# Patient Record
Sex: Female | Born: 1965 | Race: Black or African American | Hispanic: No | Marital: Single | Smoking: Former smoker
Health system: Southern US, Community
[De-identification: ages and names within clinical notes are randomized; demographics above are authoritative.]

## PROBLEM LIST (undated history)

## (undated) DIAGNOSIS — R5383 Other fatigue: Secondary | ICD-10-CM

## (undated) DIAGNOSIS — R011 Cardiac murmur, unspecified: Secondary | ICD-10-CM

## (undated) DIAGNOSIS — E119 Type 2 diabetes mellitus without complications: Secondary | ICD-10-CM

## (undated) DIAGNOSIS — E785 Hyperlipidemia, unspecified: Secondary | ICD-10-CM

## (undated) DIAGNOSIS — G4733 Obstructive sleep apnea (adult) (pediatric): Secondary | ICD-10-CM

## (undated) DIAGNOSIS — I2510 Atherosclerotic heart disease of native coronary artery without angina pectoris: Secondary | ICD-10-CM

## (undated) DIAGNOSIS — I10 Essential (primary) hypertension: Secondary | ICD-10-CM

## (undated) DIAGNOSIS — Z87891 Personal history of nicotine dependence: Secondary | ICD-10-CM

## (undated) HISTORY — DX: Essential (primary) hypertension: I10

## (undated) HISTORY — DX: Atherosclerotic heart disease of native coronary artery without angina pectoris: I25.10

## (undated) HISTORY — DX: Hyperlipidemia, unspecified: E78.5

## (undated) HISTORY — DX: Obstructive sleep apnea (adult) (pediatric): G47.33

## (undated) HISTORY — DX: Type 2 diabetes mellitus without complications: E11.9

## (undated) HISTORY — DX: Other fatigue: R53.83

## (undated) HISTORY — PX: ABDOMINAL HYSTERECTOMY: SHX81

## (undated) HISTORY — DX: Personal history of nicotine dependence: Z87.891

---

## 2005-10-15 ENCOUNTER — Ambulatory Visit: Payer: Self-pay | Admitting: Obstetrics and Gynecology

## 2012-02-15 ENCOUNTER — Other Ambulatory Visit: Payer: Self-pay | Admitting: Family Medicine

## 2016-07-22 DIAGNOSIS — L84 Corns and callosities: Secondary | ICD-10-CM | POA: Insufficient documentation

## 2016-07-22 DIAGNOSIS — E1165 Type 2 diabetes mellitus with hyperglycemia: Secondary | ICD-10-CM

## 2016-07-22 DIAGNOSIS — M7742 Metatarsalgia, left foot: Secondary | ICD-10-CM

## 2016-07-22 DIAGNOSIS — IMO0001 Reserved for inherently not codable concepts without codable children: Secondary | ICD-10-CM | POA: Insufficient documentation

## 2016-07-22 HISTORY — DX: Corns and callosities: L84

## 2016-07-22 HISTORY — DX: Metatarsalgia, left foot: M77.42

## 2018-02-11 DIAGNOSIS — K859 Acute pancreatitis without necrosis or infection, unspecified: Secondary | ICD-10-CM | POA: Diagnosis not present

## 2018-02-11 DIAGNOSIS — R109 Unspecified abdominal pain: Secondary | ICD-10-CM | POA: Diagnosis not present

## 2018-02-12 DIAGNOSIS — K859 Acute pancreatitis without necrosis or infection, unspecified: Secondary | ICD-10-CM | POA: Diagnosis not present

## 2018-02-12 DIAGNOSIS — R109 Unspecified abdominal pain: Secondary | ICD-10-CM | POA: Diagnosis not present

## 2018-06-08 ENCOUNTER — Encounter: Payer: Self-pay | Admitting: Cardiology

## 2018-06-24 ENCOUNTER — Ambulatory Visit: Payer: BLUE CROSS/BLUE SHIELD | Admitting: Cardiology

## 2018-06-24 ENCOUNTER — Telehealth: Payer: Self-pay

## 2018-06-24 DIAGNOSIS — E119 Type 2 diabetes mellitus without complications: Secondary | ICD-10-CM

## 2018-06-24 DIAGNOSIS — G4730 Sleep apnea, unspecified: Secondary | ICD-10-CM

## 2018-06-24 HISTORY — DX: Sleep apnea, unspecified: G47.30

## 2018-06-24 HISTORY — DX: Type 2 diabetes mellitus without complications: E11.9

## 2018-06-24 NOTE — Telephone Encounter (Signed)
Patient arrived to the office with a young child.  Her protocol and new virus advisories we told her to get back to her home and that we would be glad to see her if she comes by herself.  In the interim we also called her nurse practitioner Hulan Fray and discussed the situation with her.  Ms. Melinda Gordon mentioned to me that the patient was referred only for an abnormal EKG for preop.  She had no symptoms and since the elective surgeries have been postponed this referral and evaluation can also be postponed.  She said she will touch base with the patient and I referred when appropriate.  We will also keep the patient on her recall list according to protocol and the patient also has been advised to call us back for questions.

## 2018-06-24 NOTE — Telephone Encounter (Signed)
Patient was turned away today for new patient appt because she came with her child. Due to [facility] COVID protocol, patient only allowed in building. Dr. Janey Genta consulted and approved denial. Patient was referred by Hulan Fray, NP for an abnormal EKG. RN tried to set up a doctor/doctor conversation but Hulan Fray, NP is not in office. Message left for [place] Medical Director to call and speak with Dr.RRR.

## 2019-09-07 DIAGNOSIS — E1142 Type 2 diabetes mellitus with diabetic polyneuropathy: Secondary | ICD-10-CM

## 2019-09-07 HISTORY — DX: Type 2 diabetes mellitus with diabetic polyneuropathy: E11.42

## 2020-06-28 DIAGNOSIS — I10 Essential (primary) hypertension: Secondary | ICD-10-CM

## 2020-06-28 HISTORY — DX: Essential (primary) hypertension: I10

## 2020-11-22 ENCOUNTER — Encounter: Payer: Self-pay | Admitting: Physician Assistant

## 2020-11-22 LAB — HM MAMMOGRAPHY

## 2021-05-27 ENCOUNTER — Telehealth: Payer: Self-pay

## 2021-05-27 NOTE — Telephone Encounter (Signed)
I tried to contact the patient to confirm her new patient appointment for tomorrow, along with going over any pre-registration questions. I was unable to leave a message. The phone rang once and stated the call can not be completed at this time.

## 2021-05-28 ENCOUNTER — Ambulatory Visit (INDEPENDENT_AMBULATORY_CARE_PROVIDER_SITE_OTHER): Payer: 59 | Admitting: Physician Assistant

## 2021-05-28 ENCOUNTER — Encounter: Payer: Self-pay | Admitting: Physician Assistant

## 2021-05-28 ENCOUNTER — Other Ambulatory Visit: Payer: Self-pay

## 2021-05-28 VITALS — BP 130/70 | HR 99 | Temp 97.2°F | Ht 63.5 in | Wt 227.2 lb

## 2021-05-28 DIAGNOSIS — E559 Vitamin D deficiency, unspecified: Secondary | ICD-10-CM

## 2021-05-28 DIAGNOSIS — M79642 Pain in left hand: Secondary | ICD-10-CM | POA: Diagnosis not present

## 2021-05-28 DIAGNOSIS — M79641 Pain in right hand: Secondary | ICD-10-CM | POA: Diagnosis not present

## 2021-05-28 DIAGNOSIS — E1165 Type 2 diabetes mellitus with hyperglycemia: Secondary | ICD-10-CM | POA: Diagnosis not present

## 2021-05-28 MED ORDER — METFORMIN HCL 1000 MG PO TABS: 1000.0000 mg | ORAL_TABLET | Freq: Two times a day (BID) | ORAL | 0 refills | Status: AC

## 2021-05-28 MED ORDER — GLIMEPIRIDE 4 MG PO TABS: 4.0000 mg | ORAL_TABLET | Freq: Every day | ORAL | 0 refills | Status: DC

## 2021-05-28 MED ORDER — PIOGLITAZONE HCL 45 MG PO TABS: 45.0000 mg | ORAL_TABLET | Freq: Every day | ORAL | 0 refills | Status: DC

## 2021-05-28 NOTE — Progress Notes (Signed)
New Patient Office Visit  Subjective:  Patient ID: Melinda Gordon, female    DOB: [birth year]  Age: 56 y.o. MRN: [id number]  CC:  Chief Complaint  Patient presents with   Hand Pain   NEW PATIENT HPI Melinda Gordon presents for bilateral hand pain. She states that she has had trouble with her hands for quite awhile.  She has actually seen 2 hand specialists in past and has had OT as well.  Would like referral to specialist in her network.  She has had trouble with trigger fingers and generalized hand pain.  Records show she had carpal tunnel work up as well  Pt with history of NIDDM - states she has had for over 20 years.  She is currently taking amaryl, jardiance, metformin, and actos.  She had been seeing endocrinology but with new insurance needs referral to new provider.  She states she never takes her glucose readings, has not been to see optometrist, REFUSES statin or ACE/ARB treatment despite being told benefits of these medications  Pt with history of depression with anxiety - states symptoms well controlled on wellbutrin  Pt with history of stress incontinence and taking vesicare $RemoveBeforeD'5mg'[id number]$  qd - works well for her  Past Medical History:  Diagnosis Date   Diabetes mellitus without complication ([place])    Hyperlipidemia    Hypertension     Past Surgical History:  Procedure Laterality Date   ABDOMINAL HYSTERECTOMY      History reviewed. No pertinent family history.  Social History   Socioeconomic History   Marital status: Single    Spouse name: Not on file   Number of children: Not on file   Years of education: Not on file   Highest education level: Not on file  Occupational History   Not on file  Tobacco Use   Smoking status: Former    Years: 15.00    Types: Cigarettes   Smokeless tobacco: Never  Substance and Sexual Activity   Alcohol use: Never   Drug use: Never   Sexual activity: Not on file  Other Topics Concern   Not on file  Social History Narrative    Not on file   Social Determinants of Health   Financial Resource Strain: Not on file  Food Insecurity: Not on file  Transportation Needs: Not on file  Physical Activity: Not on file  Stress: Not on file  Social Connections: Not on file  Intimate Partner Violence: Not on file     Current Outpatient Medications:    aspirin 81 MG EC tablet, Take by mouth., Disp: , Rfl:    buPROPion (WELLBUTRIN XL) 300 MG 24 hr tablet, TAKE 1 TABLET BY MOUTH EVERY 24 HOURS, Disp: , Rfl:    Cholecalciferol (D-3-5 PO), Take by mouth., Disp: , Rfl:    glucose blood (ONETOUCH VERIO) test strip, 3 (three) times daily., Disp: , Rfl:    JARDIANCE 25 MG TABS tablet, Take by mouth., Disp: , Rfl:    Omega-3 Fatty Acids (OMEGA-3 FISH OIL PO), Take by mouth., Disp: , Rfl:    OneTouch Delica Lancets 78I MISC, 3 (three) times daily., Disp: , Rfl:    solifenacin (VESICARE) 5 MG tablet, Take 5 mg by mouth daily., Disp: , Rfl:    glimepiride (AMARYL) 4 MG tablet, Take 1 tablet (4 mg total) by mouth daily with breakfast., Disp: 90 tablet, Rfl: 0   metFORMIN (GLUCOPHAGE) 1000 MG tablet, Take 1 tablet (1,000 mg total) by mouth 2 (two) times daily., Disp:  180 tablet, Rfl: 0   pioglitazone (ACTOS) 45 MG tablet, Take 1 tablet (45 mg total) by mouth daily., Disp: 90 tablet, Rfl: 0   Allergies  Allergen Reactions   Dicloxacillin    Cephalexin Rash    Causes acute pancreatitis     ROS CONSTITUTIONAL: Negative for chills, fatigue, fever, unintentional weight gain and unintentional weight loss.  E/N/T: Negative for ear pain, nasal congestion and sore throat.  CARDIOVASCULAR: Negative for chest pain, dizziness, palpitations and pedal edema.  RESPIRATORY: Negative for recent cough and dyspnea.  GASTROINTESTINAL: Negative for abdominal pain, acid reflux symptoms, constipation, diarrhea, nausea and vomiting.  MSK: Negative for arthralgias and myalgias.  INTEGUMENTARY: Negative for rash.  NEUROLOGICAL: Negative for dizziness  and headaches.  PSYCHIATRIC: Negative for sleep disturbance and to question depression screen.  Negative for depression, negative for anhedonia.        Objective:   PHYSICAL EXAM:   VS: BP 130/70 (BP Location: Left Arm, Patient Position: Sitting, Cuff Size: Large)    Pulse 99    Temp (!) 97.2 F (36.2 C) (Temporal)    Ht 5' 3.5" (1.613 m)    Wt 227 lb 3.2 oz (103.1 kg)    SpO2 96%    BMI 39.62 kg/m   GEN: Well nourished, well developed, in no acute distress  Cardiac: RRR; no murmurs, rubs, or gallops,no edema -  Respiratory:  normal respiratory rate and pattern with no distress - normal breath sounds with no rales, rhonchi, wheezes or rubs Skin: warm and dry, no rash  Psych: euthymic mood, appropriate affect and demeanor  Health Maintenance Due  Topic Date Due   HEMOGLOBIN A1C  Never done   FOOT EXAM  Never done   OPHTHALMOLOGY EXAM  Never done   URINE MICROALBUMIN  Never done    There are no preventive care reminders to display for this patient.  No results found for: TSH No results found for: WBC, HGB, HCT, MCV, PLT No results found for: NA, K, CHLORIDE, CO2, GLUCOSE, BUN, CREATININE, BILITOT, ALKPHOS, AST, ALT, PROT, ALBUMIN, CALCIUM, ANIONGAP, EGFR, GFR No results found for: CHOL No results found for: HDL No results found for: LDLCALC No results found for: TRIG No results found for: CHOLHDL No results found for: HGBA1C    Assessment & Plan:   Problem List Items Addressed This Visit       Endocrine   Diabetes ([place]) - Primary   Relevant Medications   aspirin 81 MG EC tablet   JARDIANCE 25 MG TABS tablet   glimepiride (AMARYL) 4 MG tablet   metFORMIN (GLUCOPHAGE) 1000 MG tablet   pioglitazone (ACTOS) 45 MG tablet   Other Relevant Orders   Microalbumin / creatinine urine ratio   CBC with Differential/Platelet   Comprehensive metabolic panel   TSH   Lipid panel   Hemoglobin A1c   Ambulatory referral to Endocrinology   Other Visit Diagnoses     Vitamin D  deficiency       Relevant Orders   VITAMIN D 25 Hydroxy (Vit-D Deficiency, Fractures)   Bilateral hand pain       Relevant Orders   Ambulatory referral to Orthopedic Surgery       Meds ordered this encounter  Medications   glimepiride (AMARYL) 4 MG tablet    Sig: Take 1 tablet (4 mg total) by mouth daily with breakfast.    Dispense:  90 tablet    Refill:  0    Order Specific Question:   Supervising  Provider    Answer:   Rochel Brome [[id number]]   metFORMIN (GLUCOPHAGE) 1000 MG tablet    Sig: Take 1 tablet (1,000 mg total) by mouth 2 (two) times daily.    Dispense:  180 tablet    Refill:  0    Order Specific Question:   Supervising Provider    Answer:   Shelton Silvas   pioglitazone (ACTOS) 45 MG tablet    Sig: Take 1 tablet (45 mg total) by mouth daily.    Dispense:  90 tablet    Refill:  0    Order Specific Question:   Supervising Provider    Answer:   Shelton Silvas    Follow-up: Return in about 3 months (around 08/25/2021) for chronic fasting follow up --- tomorrow for fasting labwork nurse.    SARA R Chrishawn Boley, PA-C

## 2021-05-29 ENCOUNTER — Other Ambulatory Visit: Payer: 59

## 2021-06-05 ENCOUNTER — Ambulatory Visit (INDEPENDENT_AMBULATORY_CARE_PROVIDER_SITE_OTHER): Payer: 59

## 2021-06-05 ENCOUNTER — Other Ambulatory Visit: Payer: 59

## 2021-06-05 ENCOUNTER — Other Ambulatory Visit: Payer: Self-pay

## 2021-06-05 ENCOUNTER — Encounter: Payer: Self-pay | Admitting: Orthopedic Surgery

## 2021-06-05 ENCOUNTER — Ambulatory Visit (INDEPENDENT_AMBULATORY_CARE_PROVIDER_SITE_OTHER): Payer: 59 | Admitting: Orthopedic Surgery

## 2021-06-05 ENCOUNTER — Ambulatory Visit: Payer: Self-pay

## 2021-06-05 VITALS — BP 142/77 | HR 70 | Ht 63.5 in | Wt 227.0 lb

## 2021-06-05 DIAGNOSIS — M79641 Pain in right hand: Secondary | ICD-10-CM | POA: Diagnosis not present

## 2021-06-05 DIAGNOSIS — M79644 Pain in right finger(s): Secondary | ICD-10-CM

## 2021-06-05 DIAGNOSIS — M79642 Pain in left hand: Secondary | ICD-10-CM

## 2021-06-05 NOTE — Progress Notes (Signed)
? ?Office Visit Note ?  ?Patient: Melinda Gordon           ?Date of Birth: Dec 22, 1965           ?MRN: [id number] ?Visit Date: 06/05/2021 ?             ?Requested by: Marge Duncans, PA-C ?[address] ?[address] ?[place],  [place] [address] ?PCP: Marge Duncans, PA-C ? ? ?Assessment & Plan: ?Visit Diagnoses:  ?1. Pain in both hands   ?2. Finger pain, right   ? ? ?Plan: Patient's atraumatic right index finger pain and swelling may be secondary to a mass at the level of the proximal phalanx.  The swelling seems to be limited to the radial aspect of the index finger over the proximal phalanx between the MP and PIP joints.  The joints themselves do not appear swollen.  She may also have some triggering with subjective locking sensation on full passive range of motion.  We discussed getting an ultrasound to further evaluate this radial aspect of her finger.  It may show [place] a mass in the area but it may also show diffuse swelling. ? ?Follow-Up Instructions: No follow-ups on file.  ? ?Orders:  ?Orders Placed This Encounter  ?Procedures  ? XR Hand Complete Right  ? XR Hand Complete Left  ? [place] RT UPPER EXTREM LTD SOFT TISSUE NON VASCULAR  ? ?No orders of the defined types were placed in this encounter. ? ? ? ? Procedures: ?No procedures performed ? ? ?Clinical Data: ?No additional findings. ? ? ?Subjective: ?Chief Complaint  ?Patient presents with  ? Right Hand - Pain  ? Left Hand - Pain  ? ? ?This is a 56 year old right-hand-dominant female who presents with pain and swelling involving the right index finger.  This has been going on for about a month.  She denies any injury to this finger.  She denies any cuts, scrapes, or other breaks in the skin.  She has decreased range of motion of this finger secondary to pain and tightness.  The swelling seems to be localized to the area of the radial aspect of the proximal phalanx between the MP and PIP joints.  The joints themselves do not appear swollen.  She has questionable subjective  triggering this finger. ? ? ?Review of Systems ? ? ?Objective: ?Vital Signs: BP (!) 142/77 (BP Location: Left Arm, Patient Position: Sitting)   Pulse 70   Ht 5' 3.5" (1.613 m)   Wt 227 lb (103 kg)   BMI 39.58 kg/m?  ? ?Physical Exam ?Constitutional:   ?   Appearance: Normal appearance.  ?Cardiovascular:  ?   Rate and Rhythm: Normal rate.  ?   Pulses: Normal pulses.  ?Pulmonary:  ?   Effort: Pulmonary effort is normal.  ?Skin: ?   General: Skin is warm and dry.  ?   Capillary Refill: Capillary refill takes less than 2 seconds.  ?Neurological:  ?   Mental Status: She is alert.  ? ? ?Right Hand Exam  ? ?Tenderness  ?Right hand tenderness location: Mildly TTP at radial aspect of index finger between MP and PIP joints. ? ?Other  ?Erythema: absent ?Sensation: normal ?Pulse: present ? ?Comments:  Fullness at radial aspect of index finger over proximal phalanx.  No swelling proximal or distal.  Limited AROM secondary to pain and tightness.  Near full PROM but lacks approx 1 cm from [facility] and limited by pain.  ? ? ? ? ?Specialty Comments:  ?No specialty  comments available. ? ?Imaging: ?No results found. ? ? ?PMFS History: ?Patient Active Problem List  ? Diagnosis Date Noted  ? Essential hypertension 06/28/2020  ? Diabetic polyneuropathy associated with type 2 diabetes mellitus ([place]) 09/07/2019  ? Diabetes ([place]) 06/24/2018  ? Sleep apnea 06/24/2018  ? Foot callus 07/22/2016  ? Metatarsalgia, left foot 07/22/2016  ? Uncontrolled type 2 diabetes mellitus without complication, without long-term current use of insulin 07/22/2016  ? ?Past Medical History:  ?Diagnosis Date  ? Diabetes mellitus without complication ([place])   ? Hyperlipidemia   ? Hypertension   ?  ?No family history on file.  ?Past Surgical History:  ?Procedure Laterality Date  ? ABDOMINAL HYSTERECTOMY    ? ?Social History  ? ?Occupational History  ? Not on file  ?Tobacco Use  ? Smoking status: Former  ?  Years: 15.00  ?  Types: Cigarettes  ? Smokeless tobacco: Never   ?Substance and Sexual Activity  ? Alcohol use: Never  ? Drug use: Never  ? Sexual activity: Not on file  ? ? ? ? ? ? ?

## 2021-06-06 LAB — COMPREHENSIVE METABOLIC PANEL
ALT: 17 IU/L (ref 0–32)
AST: 15 IU/L (ref 0–40)
Albumin/Globulin Ratio: 1.8 (ref 1.2–2.2)
Albumin: 4.8 g/dL (ref 3.8–4.9)
Alkaline Phosphatase: 108 IU/L (ref 44–121)
BUN/Creatinine Ratio: 17 (ref 9–23)
BUN: 17 mg/dL (ref 6–24)
Bilirubin Total: <0.2 mg/dL (ref 0.0–1.2)
CO2: 23 mmol/L (ref 20–29)
Calcium: 10.1 mg/dL (ref 8.7–10.2)
Chloride: 106 mmol/L (ref 96–106)
Creatinine, Ser: 1.02 mg/dL — ABNORMAL HIGH (ref 0.57–1.00)
Globulin, Total: 2.6 g/dL (ref 1.5–4.5)
Glucose: 240 mg/dL — ABNORMAL HIGH (ref 70–99)
Potassium: 5.5 mmol/L — ABNORMAL HIGH (ref 3.5–5.2)
Sodium: 143 mmol/L (ref 134–144)
Total Protein: 7.4 g/dL (ref 6.0–8.5)
eGFR: 65 mL/min/{1.73_m2} (ref >59)

## 2021-06-06 LAB — CBC WITH DIFFERENTIAL/PLATELET
Basophils Absolute: 0 10*3/uL (ref 0.0–0.2)
Basos: 1 %
EOS (ABSOLUTE): 0.1 10*3/uL (ref 0.0–0.4)
Eos: 2 %
Hematocrit: 40.2 % (ref 34.0–46.6)
Hemoglobin: 13.1 g/dL (ref 11.1–15.9)
Immature Grans (Abs): 0 10*3/uL (ref 0.0–0.1)
Immature Granulocytes: 1 %
Lymphocytes Absolute: 2.4 10*3/uL (ref 0.7–3.1)
Lymphs: 38 %
MCH: 28.1 pg (ref 26.6–33.0)
MCHC: 32.6 g/dL (ref 31.5–35.7)
MCV: 86 fL (ref 79–97)
Monocytes Absolute: 0.6 10*3/uL (ref 0.1–0.9)
Monocytes: 9 %
Neutrophils Absolute: 3.2 10*3/uL (ref 1.4–7.0)
Neutrophils: 49 %
Platelets: 311 10*3/uL (ref 150–450)
RBC: 4.66 x10E6/uL (ref 3.77–5.28)
RDW: 12.9 % (ref 11.7–15.4)
WBC: 6.2 10*3/uL (ref 3.4–10.8)

## 2021-06-06 LAB — TSH: TSH: 1.27 u[IU]/mL (ref 0.450–4.500)

## 2021-06-06 LAB — HEMOGLOBIN A1C
Est. average glucose Bld gHb Est-mCnc: 226 mg/dL
Hgb A1c MFr Bld: 9.5 % — ABNORMAL HIGH (ref 4.8–5.6)

## 2021-06-06 LAB — CARDIOVASCULAR RISK ASSESSMENT

## 2021-06-06 LAB — LIPID PANEL
Chol/HDL Ratio: 4.1 ratio (ref 0.0–4.4)
Cholesterol, Total: 243 mg/dL — ABNORMAL HIGH (ref 100–199)
HDL: 60 mg/dL (ref >39)
LDL Chol Calc (NIH): 165 mg/dL — ABNORMAL HIGH (ref 0–99)
Triglycerides: 100 mg/dL (ref 0–149)
VLDL Cholesterol Cal: 18 mg/dL (ref 5–40)

## 2021-06-06 LAB — VITAMIN D 25 HYDROXY (VIT D DEFICIENCY, FRACTURES): Vit D, 25-Hydroxy: 56.1 ng/mL (ref 30.0–100.0)

## 2021-06-09 ENCOUNTER — Telehealth: Payer: Self-pay

## 2021-06-09 NOTE — Telephone Encounter (Signed)
Pt has been referred to endocrinology --- will let them address and treat since patient noncompliant ?

## 2021-06-09 NOTE — Telephone Encounter (Signed)
Patient notified of lab results.  She refused a statin and she also refused an injectable medication  Will make Marianne Sofia, PA aware of patient's decision.   ?

## 2021-06-12 ENCOUNTER — Ambulatory Visit (HOSPITAL_COMMUNITY)
Admission: RE | Admit: 2021-06-12 | Discharge: 2021-06-12 | Disposition: A | Discharge status: Home / Self Care | Payer: 59 | Source: Ambulatory Visit | Attending: Orthopedic Surgery | Admitting: Orthopedic Surgery

## 2021-06-12 ENCOUNTER — Other Ambulatory Visit: Payer: Self-pay

## 2021-06-12 DIAGNOSIS — M79644 Pain in right finger(s): Secondary | ICD-10-CM | POA: Insufficient documentation

## 2021-06-13 ENCOUNTER — Other Ambulatory Visit: Payer: Self-pay | Admitting: Physician Assistant

## 2021-07-07 ENCOUNTER — Encounter: Payer: Self-pay | Admitting: Physician Assistant

## 2021-07-07 ENCOUNTER — Ambulatory Visit (INDEPENDENT_AMBULATORY_CARE_PROVIDER_SITE_OTHER): Payer: 59 | Admitting: Physician Assistant

## 2021-07-07 VITALS — BP 138/86 | HR 88 | Temp 98.5°F | Ht 63.5 in | Wt 228.2 lb

## 2021-07-07 DIAGNOSIS — G4739 Other sleep apnea: Secondary | ICD-10-CM | POA: Diagnosis not present

## 2021-07-07 DIAGNOSIS — E1165 Type 2 diabetes mellitus with hyperglycemia: Secondary | ICD-10-CM | POA: Diagnosis not present

## 2021-07-07 MED ORDER — RYBELSUS 3 MG PO TABS: 3.0000 mg | ORAL_TABLET | Freq: Every day | ORAL | 0 refills | Status: DC

## 2021-07-07 NOTE — Progress Notes (Signed)
? ?Subjective:  ?Patient ID: Melinda Gordon, female    DOB: [birth year]  Age: 56 y.o. MRN: [id number] ? ?Chief Complaint  ?Patient presents with  ? Diabetes  ? ? ?HPI ? Pt with history of diabetes - she has refused statin and ARB treatment - she had been referred to endocrinologist at her last appt but she has chosen to put that appt off stating she wants to discuss losing weight first ?She is on several medications for her diabetes and glucose has been out of control - she refuses any type of injectible medication including insulin to get it under control ?Pt very argumentative about refusing any type of other medications but at this point she is on maximum dose of all the other meds she is currently taking ?Pt came here today to discuss being put on medication for weight loss (which with her history of hypertension and refusal of needles and uncontrolled diabetes) I feel that rybelsus would be the best agent to try first ? ?Pt states that she has a history of sleep apnea and would like to be referred to sleep clinic to get another type of CPAP machine to help.  She is having night time awakenings  ?It has been more than 5 years since her last sleep study ?Current Outpatient Medications on File Prior to Visit  ?Medication Sig Dispense Refill  ? aspirin 81 MG EC tablet Take by mouth.    ? buPROPion (WELLBUTRIN XL) 300 MG 24 hr tablet TAKE 1 TABLET BY MOUTH EVERY 24 HOURS    ? Cholecalciferol (D-3-5 PO) Take by mouth.    ? glimepiride (AMARYL) 4 MG tablet Take 1 tablet (4 mg total) by mouth daily with breakfast. 90 tablet 0  ? JARDIANCE 25 MG TABS tablet Take by mouth.    ? metFORMIN (GLUCOPHAGE) 1000 MG tablet Take 1 tablet (1,000 mg total) by mouth 2 (two) times daily. 180 tablet 0  ? Omega-3 Fatty Acids (OMEGA-3 FISH OIL PO) Take by mouth.    ? pioglitazone (ACTOS) 45 MG tablet Take 1 tablet (45 mg total) by mouth daily. 90 tablet 0  ? solifenacin (VESICARE) 5 MG tablet TAKE 1 TABLET BY MOUTH DAILY 90 tablet 0   ? ?No current facility-administered medications on file prior to visit.  ? ?Past Medical History:  ?Diagnosis Date  ? Diabetes mellitus without complication ([facility])   ? Hyperlipidemia   ? Hypertension   ? ?Past Surgical History:  ?Procedure Laterality Date  ? ABDOMINAL HYSTERECTOMY    ?  ?History reviewed. No pertinent family history. ?Social History  ? ?Socioeconomic History  ? Marital status: Single  ?  Spouse name: Not on file  ? Number of children: Not on file  ? Years of education: Not on file  ? Highest education level: Not on file  ?Occupational History  ? Not on file  ?Tobacco Use  ? Smoking status: Former  ?  Years: 15.00  ?  Types: Cigarettes  ? Smokeless tobacco: Never  ?Substance and Sexual Activity  ? Alcohol use: Never  ? Drug use: Never  ? Sexual activity: Not on file  ?Other Topics Concern  ? Not on file  ?Social History Narrative  ? Not on file  ? ?Social Determinants of Health  ? ?Financial Resource Strain: Not on file  ?Food Insecurity: Not on file  ?Transportation Needs: Not on file  ?Physical Activity: Not on file  ?Stress: Not on file  ?Social Connections: Not on file  ? ? ?Review  of Systems ?CONSTITUTIONAL: Negative for chills, fatigue, fever, unintentional weight gain and unintentional weight loss.  ?CARDIOVASCULAR: Negative for chest pain, dizziness, palpitations and pedal edema.  ?RESPIRATORY: Negative for recent cough and dyspnea.  ?INTEGUMENTARY: Negative for rash.  ?PSYCHIATRIC: Negative for sleep disturbance and to question depression screen.  Negative for depression, negative for anhedonia.  ?   ? ? ?Objective:  ?BP 138/86   Pulse 88   Temp 98.5 ?F (36.9 ?C)   Ht 5' 3.5" (1.613 m)   Wt 228 lb 3.2 oz (103.5 kg)   SpO2 96%   BMI 39.79 kg/m?  ? ? ?  07/07/2021  ?  1:30 PM 06/05/2021  ? 10:32 AM 05/28/2021  ?  2:17 PM  ?BP/Weight  ?Systolic BP 138 142 130  ?Diastolic BP 86 77 70  ?Wt. (Lbs) 228.2 227 227.2  ?BMI 39.79 kg/m2 39.58 kg/m2 39.62 kg/m2  ? ? ?Physical Exam ?PHYSICAL EXAM:   ? ?VS: BP 138/86   Pulse 88   Temp 98.5 ?F (36.9 ?C)   Ht 5' 3.5" (1.613 m)   Wt 228 lb 3.2 oz (103.5 kg)   SpO2 96%   BMI 39.79 kg/m?  ? ?GEN: Well nourished, well developed, in no acute distress  ?Cardiac: RRR; no murmurs, ?Respiratory:  normal respiratory rate and pattern with no distress - normal breath sounds with no rales, rhonchi, wheezes or rubs ?MS: no deformity or atrophy  ?Skin: warm and dry, no rash  ?Psych: euthymic mood, appropriate affect and demeanor ? ?Diabetic Foot Exam - Simple   ?No data filed ?  ?  ? ?Lab Results  ?Component Value Date  ? WBC 6.2 06/05/2021  ? HGB 13.1 06/05/2021  ? HCT 40.2 06/05/2021  ? PLT 311 06/05/2021  ? GLUCOSE 240 (H) 06/05/2021  ? CHOL 243 (H) 06/05/2021  ? TRIG 100 06/05/2021  ? HDL 60 06/05/2021  ? LDLCALC 165 (H) 06/05/2021  ? ALT 17 06/05/2021  ? AST 15 06/05/2021  ? NA 143 06/05/2021  ? K 5.5 (H) 06/05/2021  ? CL 106 06/05/2021  ? CREATININE 1.02 (H) 06/05/2021  ? BUN 17 06/05/2021  ? CO2 23 06/05/2021  ? TSH 1.270 06/05/2021  ? HGBA1C 9.5 (H) 06/05/2021  ? ? ? ? ?Assessment & Plan:  ? ?Problem List Items Addressed This Visit   ? ?  ? Respiratory  ? Sleep apnea  ? Relevant Orders  ? Ambulatory referral to Sleep Studies  ?  ? Endocrine  ? Diabetes ([facility]) - Primary  ? Relevant Medications  ? Semaglutide (RYBELSUS) 3 MG TABS  ?. ? ?Meds ordered this encounter  ?Medications  ? Semaglutide (RYBELSUS) 3 MG TABS  ?  Sig: Take 3 mg by mouth daily.  ?  Dispense:  30 tablet  ?  Refill:  0  ?  Order Specific Question:   Supervising Provider  ?  AnswerBlane Ohara [[id number]]  ? ? ?Orders Placed This Encounter  ?Procedures  ? Ambulatory referral to Sleep Studies  ?  ? ?Follow-up: Return in about 4 weeks (around 08/04/2021) for after 5/22 - for fasting follow up appt. ? ?An After Visit Summary was printed and given to the patient. ? ?SARA R Lelia Jons, PA-C ?[facility] ?[phone] ?

## 2021-07-09 ENCOUNTER — Telehealth: Payer: Self-pay

## 2021-07-09 ENCOUNTER — Telehealth: Payer: Self-pay | Admitting: Orthopedic Surgery

## 2021-07-09 NOTE — Telephone Encounter (Signed)
Patient left a voicemail requesting a referral to Dermatology. Wants to see Dr. Lois Huxley [address] [place], [place], [place], [address], for her hairloss. ?

## 2021-07-09 NOTE — Telephone Encounter (Signed)
Patient called advised she was told that she needed an MRI because when she had the Ultrasound they couldn't determine what was wrong with her right hand index finger. Patient said she need to be set up for the MRI. The number to contact patient is [phone] ?

## 2021-07-10 ENCOUNTER — Other Ambulatory Visit: Payer: Self-pay | Admitting: Physician Assistant

## 2021-07-10 DIAGNOSIS — L659 Nonscarring hair loss, unspecified: Secondary | ICD-10-CM

## 2021-08-04 ENCOUNTER — Ambulatory Visit (INDEPENDENT_AMBULATORY_CARE_PROVIDER_SITE_OTHER): Payer: 59 | Admitting: Physician Assistant

## 2021-08-04 ENCOUNTER — Encounter: Payer: Self-pay | Admitting: Physician Assistant

## 2021-08-04 VITALS — BP 132/84 | HR 99 | Temp 97.1°F | Resp 18 | Ht 63.5 in | Wt 221.6 lb

## 2021-08-04 DIAGNOSIS — R899 Unspecified abnormal finding in specimens from other organs, systems and tissues: Secondary | ICD-10-CM

## 2021-08-04 DIAGNOSIS — E1165 Type 2 diabetes mellitus with hyperglycemia: Secondary | ICD-10-CM

## 2021-08-04 DIAGNOSIS — K219 Gastro-esophageal reflux disease without esophagitis: Secondary | ICD-10-CM | POA: Diagnosis not present

## 2021-08-04 MED ORDER — RYBELSUS 7 MG PO TABS
7.0000 mg | ORAL_TABLET | Freq: Every day | ORAL | 1 refills | Status: DC
Start: 2021-08-04 — End: 2021-10-02

## 2021-08-04 MED ORDER — OMEPRAZOLE 40 MG PO CPDR: 40.0000 mg | DELAYED_RELEASE_CAPSULE | Freq: Every day | ORAL | 3 refills | Status: DC

## 2021-08-04 NOTE — Progress Notes (Signed)
? ?Subjective:  ?Patient ID: Melinda Gordon, female    DOB: December 18, 1965  Age: 56 y.o. MRN: [id number] ? ?Chief Complaint  ?Patient presents with  ? Gastroesophageal Reflux  ? ? ?HPI ? Pt states that she has had a history of reflux in the past - used to be on medication  ?Noted in the past month she has had lots of belching and burping along with reflux symptoms ? ?Pt here for follow up after starting rybelsus - she states she is doing well on this medication and thinks it is helping her glucose levels.  She has actually lost 7 pounds since last visit ?Is exercising as well ? ?Pt due to recheck cmp - last visit had slightly elevated potassium ?Current Outpatient Medications on File Prior to Visit  ?Medication Sig Dispense Refill  ? aspirin 81 MG EC tablet Take by mouth.    ? buPROPion (WELLBUTRIN XL) 300 MG 24 hr tablet TAKE 1 TABLET BY MOUTH EVERY 24 HOURS    ? Cholecalciferol (D-3-5 PO) Take by mouth.    ? glimepiride (AMARYL) 4 MG tablet Take 1 tablet (4 mg total) by mouth daily with breakfast. 90 tablet 0  ? JARDIANCE 25 MG TABS tablet Take by mouth.    ? metFORMIN (GLUCOPHAGE) 1000 MG tablet Take 1 tablet (1,000 mg total) by mouth 2 (two) times daily. 180 tablet 0  ? Omega-3 Fatty Acids (OMEGA-3 FISH OIL PO) Take by mouth.    ? pioglitazone (ACTOS) 45 MG tablet Take 1 tablet (45 mg total) by mouth daily. 90 tablet 0  ? solifenacin (VESICARE) 5 MG tablet TAKE 1 TABLET BY MOUTH DAILY 90 tablet 0  ? ?No current facility-administered medications on file prior to visit.  ? ?Past Medical History:  ?Diagnosis Date  ? Diabetes mellitus without complication (HCC)   ? Hyperlipidemia   ? Hypertension   ? ?Past Surgical History:  ?Procedure Laterality Date  ? ABDOMINAL HYSTERECTOMY    ?  ?History reviewed. No pertinent family history. ?Social History  ? ?Socioeconomic History  ? Marital status: Single  ?  Spouse name: Not on file  ? Number of children: Not on file  ? Years of education: Not on file  ? Highest education  level: Not on file  ?Occupational History  ? Not on file  ?Tobacco Use  ? Smoking status: Former  ?  Years: 15.00  ?  Types: Cigarettes  ? Smokeless tobacco: Never  ?Substance and Sexual Activity  ? Alcohol use: Never  ? Drug use: Never  ? Sexual activity: Not on file  ?Other Topics Concern  ? Not on file  ?Social History Narrative  ? Not on file  ? ?Social Determinants of Health  ? ?Financial Resource Strain: Not on file  ?Food Insecurity: Not on file  ?Transportation Needs: Not on file  ?Physical Activity: Not on file  ?Stress: Not on file  ?Social Connections: Not on file  ? ? ?Review of Systems ?CONSTITUTIONAL: Negative for chills, fatigue, fever, unintentional weight gain and unintentional weight loss.  ? ?CARDIOVASCULAR: Negative for chest pain, dizziness, palpitations and pedal edema.  ?RESPIRATORY: Negative for recent cough and dyspnea.  ?GASTROINTESTINAL:see HPI ? ? ? ?Objective:  ?BP 132/84   Pulse 99   Temp (!) 97.1 ?F (36.2 ?C)   Resp 18   Ht 5' 3.5" (1.613 m)   Wt 221 lb 9.6 oz (100.5 kg)   SpO2 98%   BMI 38.64 kg/m?  ? ? ?  08/04/2021  ?  10:14 AM 07/07/2021  ?  1:30 PM 06/05/2021  ? 10:32 AM  ?BP/Weight  ?Systolic BP 132 138 142  ?Diastolic BP 84 86 77  ?Wt. (Lbs) 221.6 228.2 227  ?BMI 38.64 kg/m2 39.79 kg/m2 39.58 kg/m2  ? ? ?Physical Exam ?PHYSICAL EXAM:  ? ?VS: BP 132/84   Pulse 99   Temp (!) 97.1 ?F (36.2 ?C)   Resp 18   Ht 5' 3.5" (1.613 m)   Wt 221 lb 9.6 oz (100.5 kg)   SpO2 98%   BMI 38.64 kg/m?  ? ?GEN: Well nourished, well developed, in no acute distress  ? ?Cardiac: RRR; no murmurs, ?Respiratory:  normal respiratory rate and pattern with no distress - normal breath sounds with no rales, rhonchi, wheezes or rubs ? ?Psych: euthymic mood, appropriate affect and demeanor ? ?Diabetic Foot Exam - Simple   ?No data filed ?  ?  ? ?Lab Results  ?Component Value Date  ? WBC 6.2 06/05/2021  ? HGB 13.1 06/05/2021  ? HCT 40.2 06/05/2021  ? PLT 311 06/05/2021  ? GLUCOSE 240 (H) 06/05/2021  ? CHOL  243 (H) 06/05/2021  ? TRIG 100 06/05/2021  ? HDL 60 06/05/2021  ? LDLCALC 165 (H) 06/05/2021  ? ALT 17 06/05/2021  ? AST 15 06/05/2021  ? NA 143 06/05/2021  ? K 5.5 (H) 06/05/2021  ? CL 106 06/05/2021  ? CREATININE 1.02 (H) 06/05/2021  ? BUN 17 06/05/2021  ? CO2 23 06/05/2021  ? TSH 1.270 06/05/2021  ? HGBA1C 9.5 (H) 06/05/2021  ? ? ? ? ?Assessment & Plan:  ? ?Problem List Items Addressed This Visit   ? ?  ? Endocrine  ? Diabetes (HCC)  ? Relevant Medications  ? Semaglutide (RYBELSUS) 7 MG TABS  ? ?Other Visit Diagnoses   ? ? Gastroesophageal reflux disease without esophagitis    -  Primary  ? Relevant Medications  ? omeprazole (PRILOSEC) 40 MG capsule  ? Abnormal laboratory test      ? Relevant Orders  ? Comprehensive metabolic panel  ? ?  ?. ? ?Meds ordered this encounter  ?Medications  ? Semaglutide (RYBELSUS) 7 MG TABS  ?  Sig: Take 7 mg by mouth daily.  ?  Dispense:  30 tablet  ?  Refill:  1  ?  Order Specific Question:   Supervising Provider  ?  AnswerBlane Ohara [[id number]]  ? omeprazole (PRILOSEC) 40 MG capsule  ?  Sig: Take 1 capsule (40 mg total) by mouth daily.  ?  Dispense:  30 capsule  ?  Refill:  3  ?  Order Specific Question:   Supervising Provider  ?  AnswerBlane Ohara [[id number]]  ? ? ?Orders Placed This Encounter  ?Procedures  ? Comprehensive metabolic panel  ?  ? ?Follow-up: Return for cancel 5/24 --- needs chronic fasting mid to late June. ? ?An After Visit Summary was printed and given to the patient. ? ?SARA R Anan Dapolito, PA-C ?[facility] ?[phone]

## 2021-08-05 LAB — COMPREHENSIVE METABOLIC PANEL
ALT: 17 IU/L (ref 0–32)
AST: 14 IU/L (ref 0–40)
Albumin/Globulin Ratio: 2 (ref 1.2–2.2)
Albumin: 4.6 g/dL (ref 3.8–4.9)
Alkaline Phosphatase: 107 IU/L (ref 44–121)
BUN/Creatinine Ratio: 21 (ref 9–23)
BUN: 20 mg/dL (ref 6–24)
Bilirubin Total: <0.2 mg/dL (ref 0.0–1.2)
CO2: 23 mmol/L (ref 20–29)
Calcium: 10.2 mg/dL (ref 8.7–10.2)
Chloride: 99 mmol/L (ref 96–106)
Creatinine, Ser: 0.97 mg/dL (ref 0.57–1.00)
Globulin, Total: 2.3 g/dL (ref 1.5–4.5)
Glucose: 187 mg/dL — ABNORMAL HIGH (ref 70–99)
Potassium: 4.7 mmol/L (ref 3.5–5.2)
Sodium: 140 mmol/L (ref 134–144)
Total Protein: 6.9 g/dL (ref 6.0–8.5)
eGFR: 69 mL/min/{1.73_m2} (ref >59)

## 2021-08-25 ENCOUNTER — Other Ambulatory Visit: Payer: Self-pay

## 2021-08-25 ENCOUNTER — Telehealth: Payer: Self-pay

## 2021-08-25 NOTE — Telephone Encounter (Signed)
Patient left a voicemail stating she was taking glimepiride bid, however her latest rx was for once daily. She stated she does not recall discussing this change and questioned if her rx is correct. I see where she recently started rybelsus, I am unsure if this is the answer to her "question".

## 2021-08-26 ENCOUNTER — Other Ambulatory Visit: Payer: Self-pay | Admitting: Family Medicine

## 2021-08-27 ENCOUNTER — Ambulatory Visit: Payer: 59 | Admitting: Physician Assistant

## 2021-08-27 ENCOUNTER — Other Ambulatory Visit: Payer: Self-pay

## 2021-08-27 DIAGNOSIS — E1165 Type 2 diabetes mellitus with hyperglycemia: Secondary | ICD-10-CM

## 2021-08-27 MED ORDER — GLIMEPIRIDE 4 MG PO TABS: 4.0000 mg | ORAL_TABLET | Freq: Every day | ORAL | 0 refills | Status: DC

## 2021-08-27 NOTE — Telephone Encounter (Signed)
Patient VU however she is in need of refill. Refill request sent to [facility].   Terrill Mohr 08/27/21 4:36 PM

## 2021-09-06 ENCOUNTER — Other Ambulatory Visit: Payer: Self-pay | Admitting: Physician Assistant

## 2021-10-01 ENCOUNTER — Ambulatory Visit (INDEPENDENT_AMBULATORY_CARE_PROVIDER_SITE_OTHER): Payer: 59 | Admitting: Physician Assistant

## 2021-10-01 ENCOUNTER — Encounter: Payer: Self-pay | Admitting: Physician Assistant

## 2021-10-01 VITALS — BP 114/68 | HR 101 | Temp 97.1°F | Ht 63.5 in | Wt 211.0 lb

## 2021-10-01 DIAGNOSIS — E1165 Type 2 diabetes mellitus with hyperglycemia: Secondary | ICD-10-CM | POA: Diagnosis not present

## 2021-10-01 DIAGNOSIS — E559 Vitamin D deficiency, unspecified: Secondary | ICD-10-CM | POA: Diagnosis not present

## 2021-10-01 DIAGNOSIS — K219 Gastro-esophageal reflux disease without esophagitis: Secondary | ICD-10-CM

## 2021-10-01 NOTE — Progress Notes (Signed)
Subjective:  Patient ID: Melinda Gordon, female    DOB: Sep 30, 1965  Age: 57 y.o. MRN: [id number]  Chief Complaint  Patient presents with   Diabetes    HPI  Pt in today for follow up of diabetes.  She states she is not checking her glucose at all - when asked why she initially says 'because not know how' and I stated I could show her now at visit or set her up for nurse visit to do this and she refused stating she actually does not like needles and 'will probably never check her glucose anyway' I advised she can speak with her insurance to see if Dexcom or Freestyle Josephine Igo is covered and she states she may call. Pt states she takes fish oil but refuses any type of statin treatment and also refuses ACE/ARB treatment Pt is overdue for eye exam - recommend to schedule appt Pt has appt in October to establish with endocrinologist  Pt with history of anxiety - stable on wellbutrin  Pt with history of GERD - stable on prilosec Current Outpatient Medications on File Prior to Visit  Medication Sig Dispense Refill   aspirin 81 MG EC tablet Take by mouth.     buPROPion (WELLBUTRIN XL) 300 MG 24 hr tablet TAKE 1 TABLET BY MOUTH EVERY 24 HOURS     Cholecalciferol (D-3-5 PO) Take by mouth.     glimepiride (AMARYL) 4 MG tablet Take 1 tablet (4 mg total) by mouth daily with breakfast. 90 tablet 0   JARDIANCE 25 MG TABS tablet Take by mouth.     metFORMIN (GLUCOPHAGE) 1000 MG tablet Take 1 tablet (1,000 mg total) by mouth 2 (two) times daily. 180 tablet 0   Omega-3 Fatty Acids (OMEGA-3 FISH OIL PO) Take by mouth.     omeprazole (PRILOSEC) 40 MG capsule Take 1 capsule (40 mg total) by mouth daily. 30 capsule 3   pioglitazone (ACTOS) 45 MG tablet Take 1 tablet (45 mg total) by mouth daily. 90 tablet 0   Semaglutide (RYBELSUS) 7 MG TABS Take 7 mg by mouth daily. 30 tablet 1   solifenacin (VESICARE) 5 MG tablet TAKE 1 TABLET BY MOUTH DAILY 90 tablet 0   No current facility-administered medications on  file prior to visit.   Past Medical History:  Diagnosis Date   Diabetes mellitus without complication (HCC)    Hyperlipidemia    Hypertension    Past Surgical History:  Procedure Laterality Date   ABDOMINAL HYSTERECTOMY      History reviewed. No pertinent family history. Social History   Socioeconomic History   Marital status: Single    Spouse name: Not on file   Number of children: Not on file   Years of education: Not on file   Highest education level: Not on file  Occupational History   Not on file  Tobacco Use   Smoking status: Former    Years: 15.00    Types: Cigarettes   Smokeless tobacco: Never  Substance and Sexual Activity   Alcohol use: Never   Drug use: Never   Sexual activity: Not on file  Other Topics Concern   Not on file  Social History Narrative   Not on file   Social Determinants of Health   Financial Resource Strain: Not on file  Food Insecurity: Not on file  Transportation Needs: Not on file  Physical Activity: Not on file  Stress: Not on file  Social Connections: Not on file    Review of Systems  CONSTITUTIONAL: Negative for chills, fatigue, fever, unintentional weight gain and unintentional weight loss.  E/N/T: Negative for ear pain, nasal congestion and sore throat.  CARDIOVASCULAR: Negative for chest pain, dizziness, palpitations and pedal edema.  RESPIRATORY: Negative for recent cough and dyspnea.  GASTROINTESTINAL: Negative for abdominal pain, acid reflux symptoms, constipation, diarrhea, nausea and vomiting.  MSK: Negative for arthralgias and myalgias.  INTEGUMENTARY: Negative for rash.  NEUROLOGICAL: Negative for dizziness and headaches.  PSYCHIATRIC: Negative for sleep disturbance and to question depression screen.  Negative for depression, negative for anhedonia.         Objective:  BP 114/68 (BP Location: Right Arm, Patient Position: Sitting)   Pulse (!) 101   Temp (!) 97.1 F (36.2 C) (Temporal)   Ht 5' 3.5" (1.613 m)    Wt 211 lb (95.7 kg)   SpO2 98%   BMI 36.79 kg/m      10/01/2021   10:07 AM 08/04/2021   10:14 AM 07/07/2021    1:30 PM  BP/Weight  Systolic BP 114 132 138  Diastolic BP 68 84 86  Wt. (Lbs) 211 221.6 228.2  BMI 36.79 kg/m2 38.64 kg/m2 39.79 kg/m2    Physical Exam PHYSICAL EXAM:   VS: BP 114/68 (BP Location: Right Arm, Patient Position: Sitting)   Pulse (!) 101   Temp (!) 97.1 F (36.2 C) (Temporal)   Ht 5' 3.5" (1.613 m)   Wt 211 lb (95.7 kg)   SpO2 98%   BMI 36.79 kg/m   GEN: Well nourished, well developed, in no acute distress  Cardiac: RRR; no murmurs, rubs, or gallops,no edema - no significant varicosities Respiratory:  normal respiratory rate and pattern with no distress - normal breath sounds with no rales, rhonchi, wheezes or rubs Skin: warm and dry, no rash  Psych: euthymic mood, appropriate affect and demeanor  Diabetic Foot Exam - Simple   Simple Foot Form Diabetic Foot exam was performed with the following findings: Yes 10/01/2021 10:34 AM  Visual Inspection No deformities, no ulcerations, no other skin breakdown bilaterally: Yes Sensation Testing Intact to touch and monofilament testing bilaterally: Yes Pulse Check Posterior Tibialis and Dorsalis pulse intact bilaterally: Yes Comments      Lab Results  Component Value Date   WBC 6.2 06/05/2021   HGB 13.1 06/05/2021   HCT 40.2 06/05/2021   PLT 311 06/05/2021   GLUCOSE 187 (H) 08/04/2021   CHOL 243 (H) 06/05/2021   TRIG 100 06/05/2021   HDL 60 06/05/2021   LDLCALC 165 (H) 06/05/2021   ALT 17 08/04/2021   AST 14 08/04/2021   NA 140 08/04/2021   K 4.7 08/04/2021   CL 99 08/04/2021   CREATININE 0.97 08/04/2021   BUN 20 08/04/2021   CO2 23 08/04/2021   TSH 1.270 06/05/2021   HGBA1C 9.5 (H) 06/05/2021      Assessment & Plan:   Problem List Items Addressed This Visit       Endocrine   Diabetes (HCC) - Primary   Relevant Orders   CBC with Differential/Platelet   Comprehensive metabolic  panel   Lipid panel   Hemoglobin A1c Continue current meds   Other Visit Diagnoses     Vitamin D deficiency     Labwork pending   Gastroesophageal reflux disease without esophagitis     Continue meds     .  No orders of the defined types were placed in this encounter.   Orders Placed This Encounter  Procedures   CBC with Differential/Platelet  Comprehensive metabolic panel   Lipid panel   Hemoglobin A1c     Follow-up: Return in about 3 months (around 01/01/2022) for chronic fasting follow up.  An After Visit Summary was printed and given to the patient.  Yetta Flock [facility] [phone]

## 2021-10-02 ENCOUNTER — Other Ambulatory Visit: Payer: Self-pay | Admitting: Physician Assistant

## 2021-10-02 LAB — CBC WITH DIFFERENTIAL/PLATELET
Basophils Absolute: 0 10*3/uL (ref 0.0–0.2)
Basos: 0 %
EOS (ABSOLUTE): 0.1 10*3/uL (ref 0.0–0.4)
Eos: 1 %
Hematocrit: 44 % (ref 34.0–46.6)
Hemoglobin: 13.6 g/dL (ref 11.1–15.9)
Immature Grans (Abs): 0 10*3/uL (ref 0.0–0.1)
Immature Granulocytes: 0 %
Lymphocytes Absolute: 2.8 10*3/uL (ref 0.7–3.1)
Lymphs: 37 %
MCH: 27.2 pg (ref 26.6–33.0)
MCHC: 30.9 g/dL — ABNORMAL LOW (ref 31.5–35.7)
MCV: 88 fL (ref 79–97)
Monocytes Absolute: 0.6 10*3/uL (ref 0.1–0.9)
Monocytes: 8 %
Neutrophils Absolute: 4 10*3/uL (ref 1.4–7.0)
Neutrophils: 54 %
Platelets: 333 10*3/uL (ref 150–450)
RBC: 5 x10E6/uL (ref 3.77–5.28)
RDW: 13.1 % (ref 11.7–15.4)
WBC: 7.5 10*3/uL (ref 3.4–10.8)

## 2021-10-02 LAB — MICROALBUMIN / CREATININE URINE RATIO
Creatinine, Urine: 132.4 mg/dL
Microalb/Creat Ratio: 256 mg/g creat — ABNORMAL HIGH (ref 0–29)
Microalbumin, Urine: 338.6 ug/mL

## 2021-10-02 LAB — COMPREHENSIVE METABOLIC PANEL
ALT: 14 IU/L (ref 0–32)
AST: 15 IU/L (ref 0–40)
Albumin/Globulin Ratio: 1.6 (ref 1.2–2.2)
Albumin: 4.7 g/dL (ref 3.8–4.9)
Alkaline Phosphatase: 107 IU/L (ref 44–121)
BUN/Creatinine Ratio: 16 (ref 9–23)
BUN: 18 mg/dL (ref 6–24)
Bilirubin Total: 0.2 mg/dL (ref 0.0–1.2)
CO2: 22 mmol/L (ref 20–29)
Calcium: 10.3 mg/dL — ABNORMAL HIGH (ref 8.7–10.2)
Chloride: 100 mmol/L (ref 96–106)
Creatinine, Ser: 1.1 mg/dL — ABNORMAL HIGH (ref 0.57–1.00)
Globulin, Total: 2.9 g/dL (ref 1.5–4.5)
Glucose: 149 mg/dL — ABNORMAL HIGH (ref 70–99)
Potassium: 4.8 mmol/L (ref 3.5–5.2)
Sodium: 139 mmol/L (ref 134–144)
Total Protein: 7.6 g/dL (ref 6.0–8.5)
eGFR: 59 mL/min/{1.73_m2} — ABNORMAL LOW (ref >59)

## 2021-10-02 LAB — LIPID PANEL
Chol/HDL Ratio: 4.1 ratio (ref 0.0–4.4)
Cholesterol, Total: 209 mg/dL — ABNORMAL HIGH (ref 100–199)
HDL: 51 mg/dL (ref >39)
LDL Chol Calc (NIH): 135 mg/dL — ABNORMAL HIGH (ref 0–99)
Triglycerides: 129 mg/dL (ref 0–149)
VLDL Cholesterol Cal: 23 mg/dL (ref 5–40)

## 2021-10-02 LAB — HEMOGLOBIN A1C
Est. average glucose Bld gHb Est-mCnc: 169 mg/dL
Hgb A1c MFr Bld: 7.5 % — ABNORMAL HIGH (ref 4.8–5.6)

## 2021-10-02 LAB — CARDIOVASCULAR RISK ASSESSMENT

## 2021-10-02 MED ORDER — RYBELSUS 14 MG PO TABS: 14.0000 mg | ORAL_TABLET | Freq: Every day | ORAL | 2 refills | Status: DC

## 2021-12-01 ENCOUNTER — Other Ambulatory Visit: Payer: Self-pay | Admitting: Physician Assistant

## 2021-12-01 DIAGNOSIS — K219 Gastro-esophageal reflux disease without esophagitis: Secondary | ICD-10-CM

## 2021-12-05 ENCOUNTER — Other Ambulatory Visit: Payer: Self-pay | Admitting: Physician Assistant

## 2021-12-05 DIAGNOSIS — E1165 Type 2 diabetes mellitus with hyperglycemia: Secondary | ICD-10-CM

## 2021-12-07 ENCOUNTER — Other Ambulatory Visit: Payer: Self-pay | Admitting: Nurse Practitioner

## 2021-12-07 DIAGNOSIS — E1165 Type 2 diabetes mellitus with hyperglycemia: Secondary | ICD-10-CM

## 2021-12-30 ENCOUNTER — Other Ambulatory Visit: Payer: Self-pay | Admitting: Physician Assistant

## 2022-01-05 ENCOUNTER — Telehealth: Payer: Self-pay

## 2022-01-05 ENCOUNTER — Ambulatory Visit: Payer: 59 | Admitting: Internal Medicine

## 2022-01-05 NOTE — Telephone Encounter (Signed)
Have pt tell you what she is taking and how she is taking it ---- I have reviewed her meds and she has been noncompliant --

## 2022-01-05 NOTE — Telephone Encounter (Signed)
Patient called and stated she is confused on what medication she should be taking for diabetes. Can you look and make sure her list is right? Also she didn't get to make her endo appointment due to them not taking her insurance and she just found out today that we don't take her insurance as well, So she is having to change her insurance to what both office will take.

## 2022-01-05 NOTE — Telephone Encounter (Signed)
Patient stated she is taking Metformin 1000 mg twice a day, glimepiride 4 mg daily, Actos 45 mg, jardiance 24 mg daily. She is not taking the rybelsus due to her pharmacy not having it in stock.

## 2022-01-06 ENCOUNTER — Ambulatory Visit: Payer: 59 | Admitting: Physician Assistant

## 2022-01-06 ENCOUNTER — Other Ambulatory Visit: Payer: Self-pay

## 2022-01-06 MED ORDER — JARDIANCE 25 MG PO TABS
25.0000 mg | ORAL_TABLET | Freq: Every day | ORAL | 1 refills | Status: AC
Start: 2022-01-06 — End: ?

## 2022-01-06 NOTE — Telephone Encounter (Signed)
Patient made aware. Verbalized Understanding. 

## 2022-01-06 NOTE — Telephone Encounter (Signed)
Patient stated that she actually has been taking half of the 14 mg, she was cutting them in half due to the medication causing her nausea but she has not been taking it as she she should have.  Rybelsus 14 mg cutting in half for about a week, she has only been taking 1/2 a day. Jardiance 25 mg  Metformin 1000 mg twice a day Actos 45 mg once a day Glimepride 4 mg once day  Patient made aware to keep medication the same until she come in for fasting follow up. Patient will schedule a fasting follow up as soon as her insurance change.

## 2022-01-06 NOTE — Telephone Encounter (Signed)
So exactly how long has she been off of any rybelsus - she could have called [place] to let us know pharamacy did not have and we could have adjusted dose or found another pharmacy that had it When was last time she even took the rybelsus 7mg ?

## 2022-02-27 ENCOUNTER — Other Ambulatory Visit: Payer: Self-pay | Admitting: Physician Assistant

## 2022-02-27 DIAGNOSIS — K219 Gastro-esophageal reflux disease without esophagitis: Secondary | ICD-10-CM

## 2022-03-16 ENCOUNTER — Other Ambulatory Visit: Payer: Self-pay

## 2022-03-19 ENCOUNTER — Other Ambulatory Visit: Payer: Self-pay

## 2022-03-31 ENCOUNTER — Other Ambulatory Visit: Payer: Self-pay | Admitting: Family Medicine

## 2022-03-31 ENCOUNTER — Other Ambulatory Visit: Payer: Self-pay | Admitting: Nurse Practitioner

## 2022-03-31 DIAGNOSIS — E1165 Type 2 diabetes mellitus with hyperglycemia: Secondary | ICD-10-CM

## 2022-10-03 ENCOUNTER — Other Ambulatory Visit: Payer: Self-pay | Admitting: Family Medicine

## 2022-10-03 DIAGNOSIS — E1165 Type 2 diabetes mellitus with hyperglycemia: Secondary | ICD-10-CM

## 2022-11-18 IMAGING — US US EXTREM UP *R* LTD
1 series · 14 of 25 positions shown · non-contrast
Comparison: Radiograph 06/05/2021

CLINICAL DATA: Right index finger swelling

EXAM:
ULTRASOUND RIGHT UPPER EXTREMITY LIMITED
TECHNIQUE: Ultrasound examination of the upper extremity soft tissues was
performed in the area of clinical concern.

[Series 1: us soft tissue right upper extremity limited (non- · 26 acquisitions, 14 frames shown]
[im 1/26]
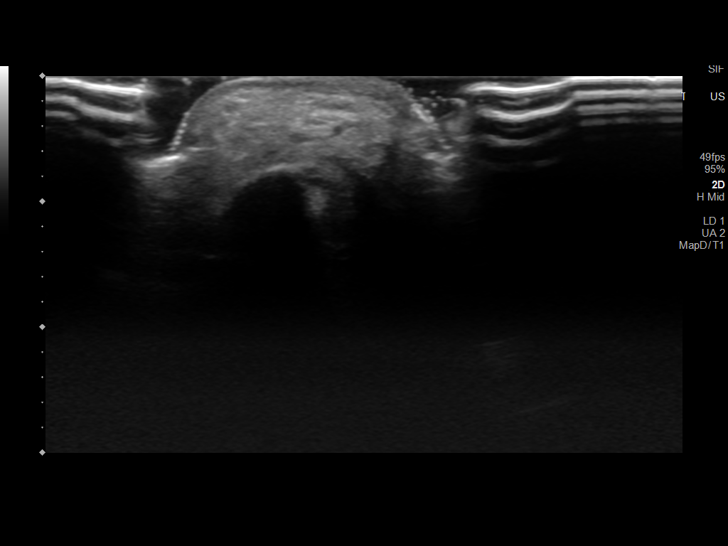
[im 3/26]
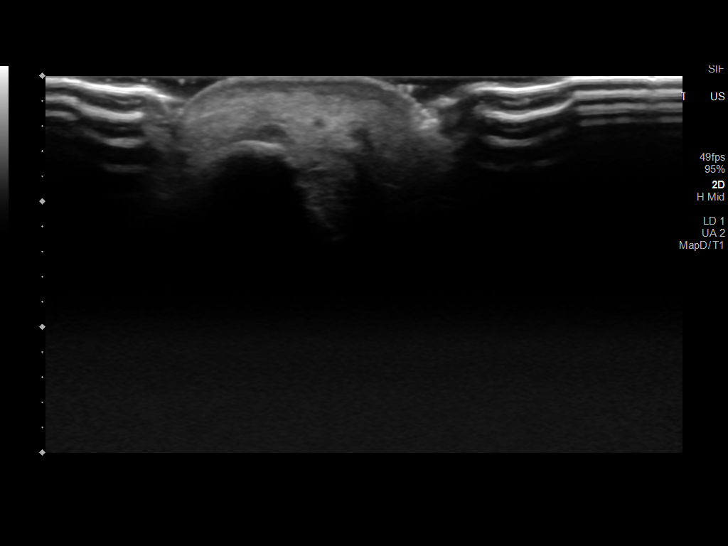
[im 5/26]
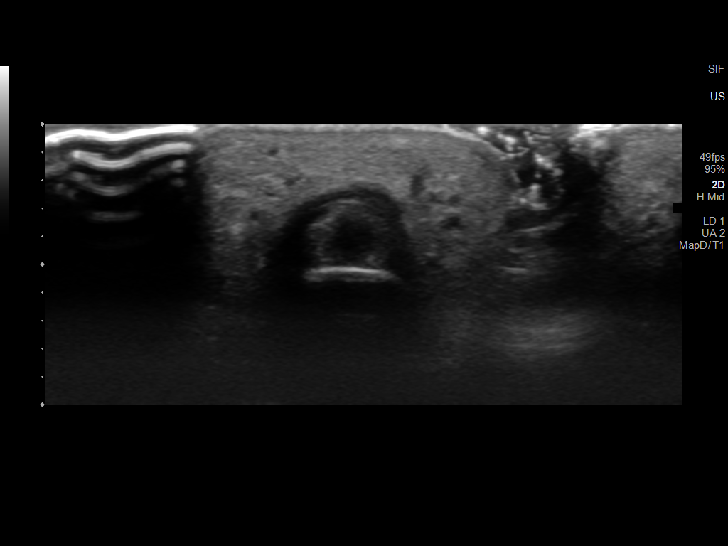
[im 7/26]
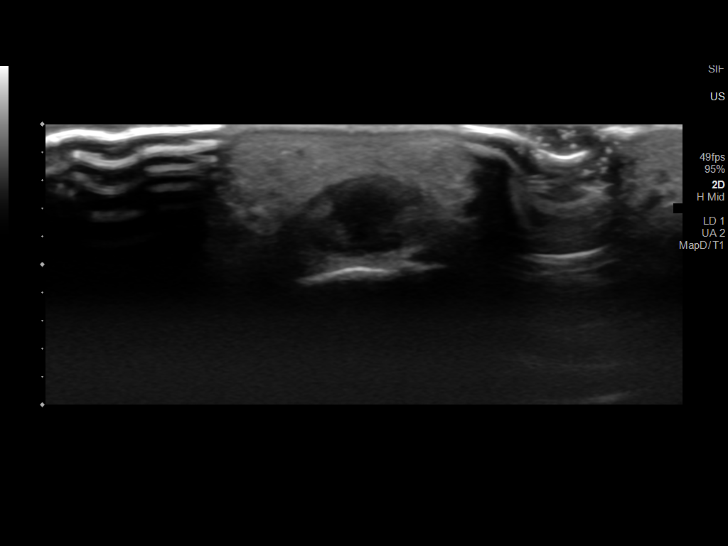
[im 9/26]
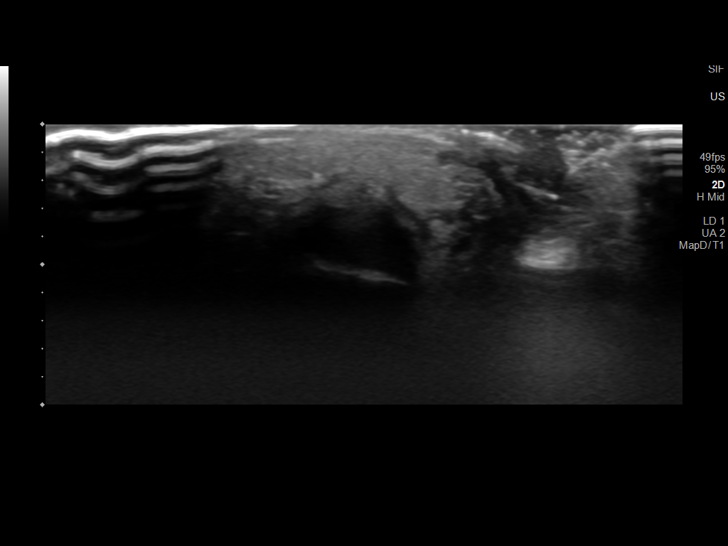
[im 10/26]
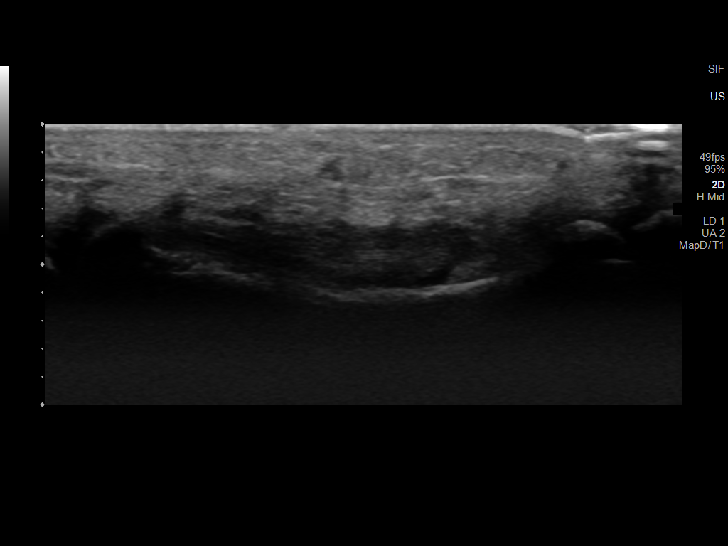
[im 12/26]
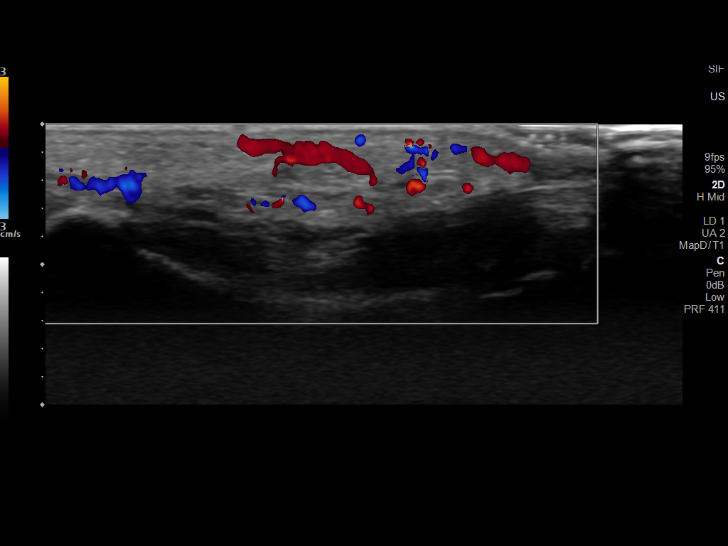
[im 14/26]
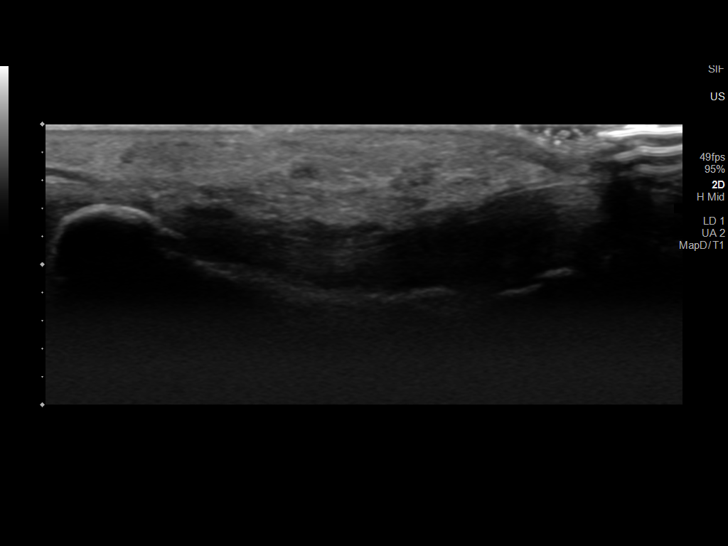
[im 16/26]
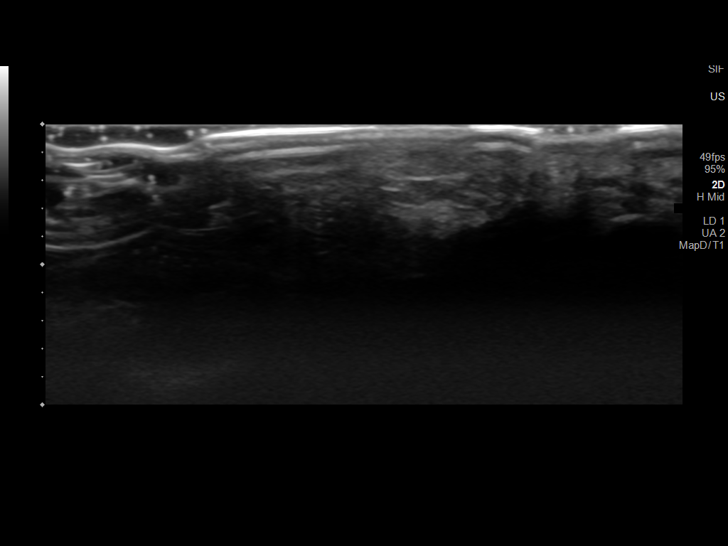
[im 17/26]
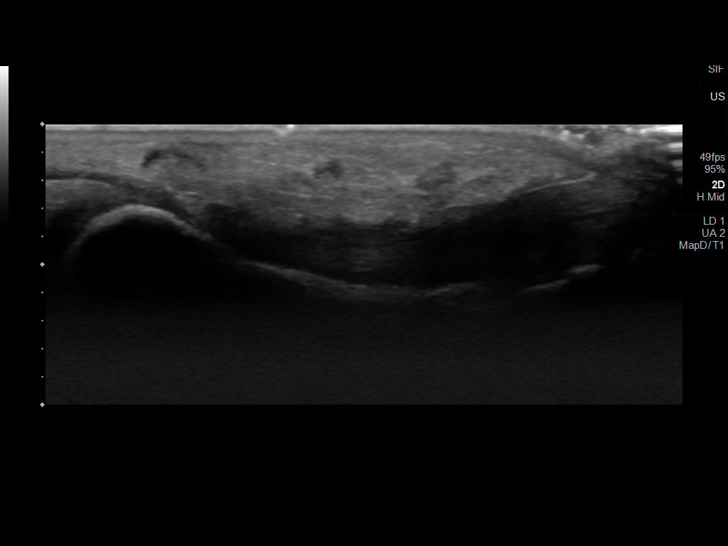
[im 19/26]
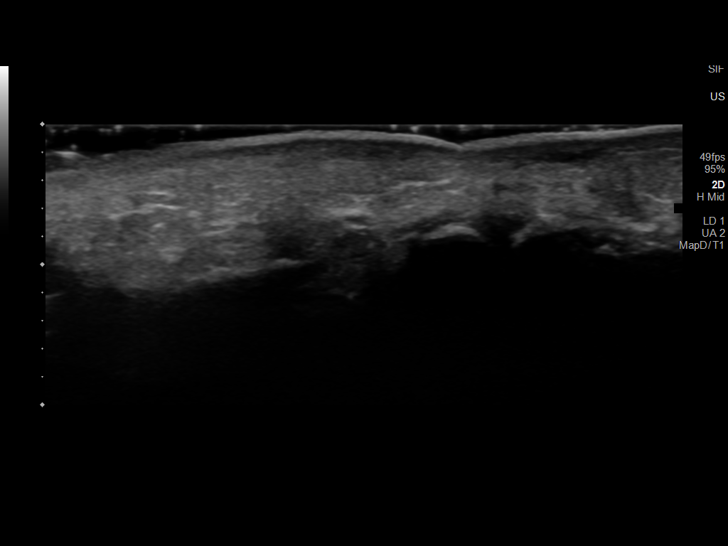
[im 21/26]
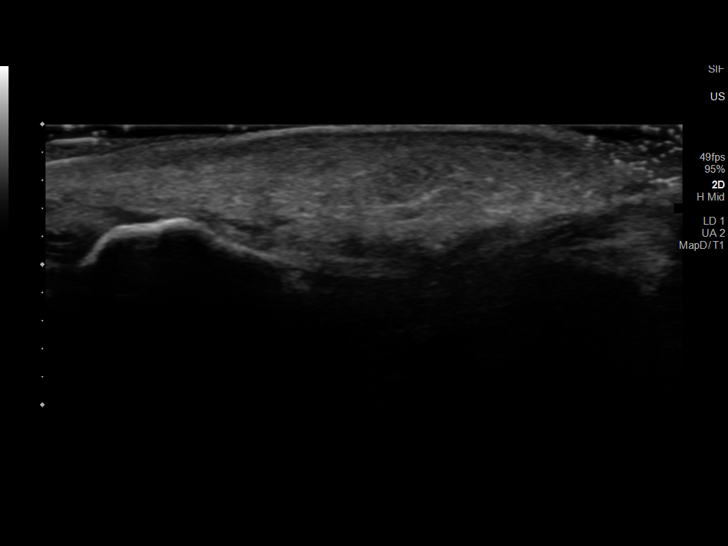
[im 23/26]
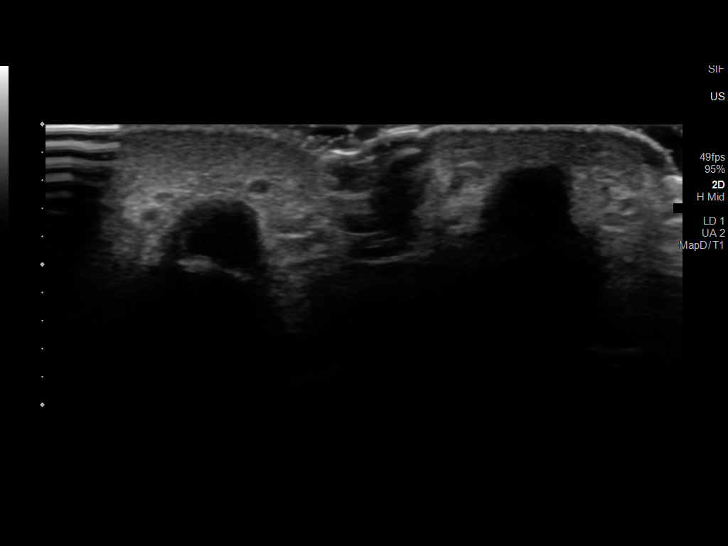
[im 26/26]
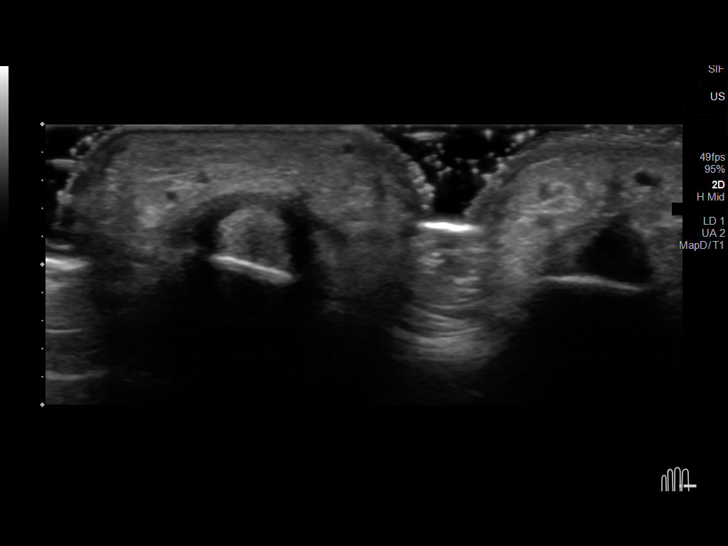

[14 of 25 positions shown; findings below may reference images not displayed]

FINDINGS: There is no acute sonographic abnormality any of concern in the
right index finger. There is no focal fluid collection. There is
mild superficial soft tissue swelling. There is cystic or solid
mass. No acute tendon abnormality or evidence of a pulley injury.
IMPRESSION: Mild superficial soft tissue swelling of the right index finger. No
evidence of abscess.

## 2023-12-23 LAB — COLOGUARD: COLOGUARD: NEGATIVE

## 2024-02-08 ENCOUNTER — Other Ambulatory Visit: Payer: Self-pay

## 2024-02-08 DIAGNOSIS — R5383 Other fatigue: Secondary | ICD-10-CM | POA: Insufficient documentation

## 2024-02-08 DIAGNOSIS — G4733 Obstructive sleep apnea (adult) (pediatric): Secondary | ICD-10-CM | POA: Insufficient documentation

## 2024-02-08 DIAGNOSIS — I2510 Atherosclerotic heart disease of native coronary artery without angina pectoris: Secondary | ICD-10-CM | POA: Insufficient documentation

## 2024-02-08 DIAGNOSIS — E119 Type 2 diabetes mellitus without complications: Secondary | ICD-10-CM | POA: Insufficient documentation

## 2024-02-08 DIAGNOSIS — Z87891 Personal history of nicotine dependence: Secondary | ICD-10-CM | POA: Insufficient documentation

## 2024-02-08 DIAGNOSIS — I10 Essential (primary) hypertension: Secondary | ICD-10-CM | POA: Insufficient documentation

## 2024-02-08 DIAGNOSIS — E785 Hyperlipidemia, unspecified: Secondary | ICD-10-CM | POA: Insufficient documentation

## 2024-02-15 ENCOUNTER — Encounter: Payer: Self-pay | Admitting: Cardiology

## 2024-02-15 ENCOUNTER — Ambulatory Visit: Attending: Cardiology | Admitting: Cardiology

## 2024-02-15 VITALS — BP 100/64 | HR 102 | Ht 63.0 in | Wt 208.6 lb

## 2024-02-15 DIAGNOSIS — R079 Chest pain, unspecified: Secondary | ICD-10-CM | POA: Diagnosis not present

## 2024-02-15 DIAGNOSIS — R5383 Other fatigue: Secondary | ICD-10-CM | POA: Insufficient documentation

## 2024-02-15 DIAGNOSIS — E669 Obesity, unspecified: Secondary | ICD-10-CM | POA: Diagnosis present

## 2024-02-15 DIAGNOSIS — E1142 Type 2 diabetes mellitus with diabetic polyneuropathy: Secondary | ICD-10-CM | POA: Insufficient documentation

## 2024-02-15 DIAGNOSIS — R011 Cardiac murmur, unspecified: Secondary | ICD-10-CM | POA: Insufficient documentation

## 2024-02-15 DIAGNOSIS — E782 Mixed hyperlipidemia: Secondary | ICD-10-CM | POA: Insufficient documentation

## 2024-02-15 DIAGNOSIS — E1165 Type 2 diabetes mellitus with hyperglycemia: Secondary | ICD-10-CM | POA: Diagnosis present

## 2024-02-15 DIAGNOSIS — I10 Essential (primary) hypertension: Secondary | ICD-10-CM | POA: Diagnosis not present

## 2024-02-15 MED ORDER — ASPIRIN 81 MG PO TBEC
81.0000 mg | DELAYED_RELEASE_TABLET | Freq: Every day | ORAL | Status: AC
Start: 2024-02-15 — End: ?

## 2024-02-15 NOTE — Patient Instructions (Signed)
 Medication Instructions:   START: Aspirin 81mg  1 tablet daily   Lab Work: CMP, Lipid- today If you have labs (blood work) drawn today and your tests are completely normal, you will receive your results only by: MyChart Message (if you have MyChart) OR A paper copy in the mail If you have any lab test that is abnormal or we need to change your treatment, we will call you to review the results.   Testing/Procedures: Your physician has requested that you have an echocardiogram. Echocardiography is a painless test that uses sound waves to create images of your heart. It provides your doctor with information about the size and shape of your heart and how well your heart's chambers and valves are working. This procedure takes approximately one hour. There are no restrictions for this procedure. Please do NOT wear cologne, perfume, aftershave, or lotions (deodorant is allowed). Please arrive 15 minutes prior to your appointment time.  Please note: We ask at that you not bring children with you during ultrasound (echo/ vascular) testing. Due to room size and safety concerns, children are not allowed in the ultrasound rooms during exams. Our front office staff cannot provide observation of children in our lobby area while testing is being conducted. An adult accompanying a patient to their appointment will only be allowed in the ultrasound room at the discretion of the ultrasound technician under special circumstances. We apologize for any inconvenience.   Your physician has requested that you have a lexiscan myoview. For further information please visit [email or web address] Please follow instruction sheet, as given.  The test will take approximately 3 to 4 hours to complete; you may bring reading material.  If someone comes with you to your appointment, they will need to remain in the main lobby due to limited space in the testing area.    How to prepare for your Myocardial Perfusion Test: Do not  eat or drink 3 hours prior to your test, except you may have water. Do not consume products containing caffeine (regular or decaffeinated) 12 hours prior to your test. (ex: coffee, chocolate, sodas, tea). Do bring a list of your current medications with you.  If not listed below, you may take your medications as normal. Do wear comfortable clothes (no dresses or overalls) and walking shoes, tennis shoes preferred (No heels or open toe shoes are allowed). Do NOT wear cologne, perfume, aftershave, or lotions (deodorant is allowed). If these instructions are not followed, your test will have to be rescheduled.     Follow-Up: At [facility], you and your health needs are our priority.  As part of our continuing mission to provide you with exceptional heart care, we have created designated Provider Care Teams.  These Care Teams include your primary Cardiologist (physician) and Advanced Practice Providers (APPs -  Physician Assistants and Nurse Practitioners) who all work together to provide you with the care you need, when you need it.  We recommend signing up for the patient portal called MyChart.  Sign up information is provided on this After Visit Summary.  MyChart is used to connect with patients for Virtual Visits (Telemedicine).  Patients are able to view lab/test results, encounter notes, upcoming appointments, etc.  Non-urgent messages can be sent to your provider as well.   To learn more about what you can do with MyChart, go to [email or web address].    Your next appointment:   9 month(s)  The format for your next appointment:   In Person  Provider:   Jennifer  Revankar, MD   Other Instructions NA

## 2024-02-15 NOTE — Progress Notes (Addendum)
 " Cardiology Office Note:    Date:  02/15/2024   ID:  Melinda Gordon, DOB 1966-03-29, MRN [id number]  PCP:  No primary care provider on file.  Cardiologist:  [name], MD   Referring MD: Chodri, Tanvir, MD    ASSESSMENT:    1. Essential hypertension   2. Type 2 diabetes mellitus with hyperglycemia, without long-term current use of insulin (HCC)   3. Diabetic polyneuropathy associated with type 2 diabetes mellitus (HCC)   4. Mixed hyperlipidemia   5. Obesity (BMI 35.0-39.9 without comorbidity)    PLAN:    In order of problems listed above:  Coronary artery calcification dyspnea on exertion:Secondary prevention stressed with the patient.  Importance of compliance with diet medication stressed and patient verbalized standing.  She has some dyspnea on exertion so we will do an exercise stress Cardiolite to assess this and to rule out any ischemic substrate. Cardiac murmur: Echocardiogram will be done to assess murmur heard on auscultation. Essential hypertension: Blood pressure is stable and diet was emphasized. Mixed dyslipidemia: On lipid-lowering medications followed by primary care.  Will check lipids today.  Goal LDL less than 60. Diabetes mellitus and obesity: Weight reduction stressed diet emphasized and patient promises to do better.  Lifestyle modification urged.  Risks of obesity explained. Ex-smoker: Quit several years ago and promises never to go back. Patient will be seen in follow-up appointment in 6 months or earlier if the patient has any concerns.  Addendum: Patient underwent exercise stress test treadmill testing and echocardiogram and they were unremarkable.  In view of this patient is not at high risk for coronary events during the aforementioned surgery.  Medical hemodynamic monitoring will further reduce risk of coronary events.  Signed Dr. Jennifer Adelis Docter 04/05/2024    Medication Adjustments/Labs and Tests Ordered: Current medicines are reviewed at  length with the patient today.  Concerns regarding medicines are outlined above.  Orders Placed This Encounter  Procedures   EKG 12-Lead   No orders of the defined types were placed in this encounter.    History of Present Illness:    Melinda Gordon is a 58 y.o. female who is being seen today for the evaluation of coronary artery calcification found on screening for lung cancer at the request of Chodri, Tanvir, MD. patient is a pleasant 58 year old female.  She has a past medical history of essential hypertension, mixed dyslipidemia, diabetes mellitus and obesity.  She leads a sedentary lifestyle.  Patient had significant calcification on CT scan of the chest so she was sent here.  She denies any chest pain orthopnea or PND.  At the time of my evaluation, the patient is alert awake oriented and in no distress.  Past Medical History:  Diagnosis Date   Atherosclerotic heart disease    Diabetes (HCC) 06/24/2018   Diabetes mellitus without complication (HCC)    Diabetic polyneuropathy associated with type 2 diabetes mellitus (HCC) 09/07/2019   Essential hypertension 06/28/2020   Fatigue    Foot callus 07/22/2016   History of nicotine dependence    Hyperlipidemia    Hypertension    Metatarsalgia, left foot 07/22/2016   OSA (obstructive sleep apnea)    Sleep apnea 06/24/2018    Past Surgical History:  Procedure Laterality Date   ABDOMINAL HYSTERECTOMY      Current Medications: Current Meds  Medication Sig   buPROPion (WELLBUTRIN XL) 300 MG 24 hr tablet TAKE 1 TABLET BY MOUTH EVERY 24 HOURS   glimepiride  (AMARYL ) 4 MG tablet TAKE  1 TABLET(4 MG) BY MOUTH DAILY WITH BREAKFAST (Patient taking differently: Take 4 mg by mouth 2 (two) times daily.)   JARDIANCE  25 MG TABS tablet Take 1 tablet (25 mg total) by mouth daily.   lisinopril (ZESTRIL) 5 MG tablet Take 5 mg by mouth daily.   metFORMIN  (GLUCOPHAGE ) 1000 MG tablet Take 1 tablet (1,000 mg total) by mouth 2 (two) times daily.    mirabegron ER (MYRBETRIQ) 25 MG TB24 tablet Take 25 mg by mouth daily.   omeprazole  (PRILOSEC) 40 MG capsule TAKE 1 CAPSULE(40 MG) BY MOUTH DAILY   OZEMPIC, 2 MG/DOSE, 8 MG/3ML SOPN Inject 2 mg into the skin once a week.   rosuvastatin  (CRESTOR ) 20 MG tablet Take 20 mg by mouth at bedtime.     Allergies:   Dicloxacillin and Cephalexin   Social History   Socioeconomic History   Marital status: Single    Spouse name: Not on file   Number of children: Not on file   Years of education: Not on file   Highest education level: Not on file  Occupational History   Not on file  Tobacco Use   Smoking status: Former    Types: Cigarettes   Smokeless tobacco: Never  Substance and Sexual Activity   Alcohol use: Never   Drug use: Never   Sexual activity: Not on file  Other Topics Concern   Not on file  Social History Narrative   Not on file   Social Drivers of Health   Financial Resource Strain: Not on file  Food Insecurity: Low Risk  (02/12/2024)   Received from [facility]   Hunger Vital Sign    Within the past 12 months, you worried that your food would run out before you got money to buy more: Never true    Within the past 12 months, the food you bought just didn't last and you didn't have money to get more. : Never true  Transportation Needs: No Transportation Needs (02/12/2024)   Received from [facility]    In the past 12 months, has lack of reliable transportation kept you from medical appointments, meetings, work or from getting things needed for daily living? : No  Physical Activity: Not on file  Stress: Not on file  Social Connections: Not on file     Family History: The patient's family history includes Cancer in her maternal grandfather, maternal grandmother, paternal grandfather, and paternal grandmother; Diabetes in her father, maternal grandfather, maternal grandmother, and mother.  ROS:   Please see the history of present illness.    All other  systems reviewed and are negative.  EKGs/Labs/Other Studies Reviewed:    The following studies were reviewed today:  EKG Interpretation Date/Time:  Tuesday February 15 2024 08:54:59 EST Ventricular Rate:  102 PR Interval:  128 QRS Duration:  72 QT Interval:  346 QTC Calculation: 450 R Axis:   83  Text Interpretation: Sinus tachycardia Cannot rule out Anterior infarct , age undetermined Abnormal ECG No previous ECGs available Confirmed by Edwyna Backers [phone]) on 02/15/2024 9:10:03 AM     Recent Labs: No results found for requested labs within last 365 days.  Recent Lipid Panel    Component Value Date/Time   CHOL 209 (H) 10/01/2021 1036   TRIG 129 10/01/2021 1036   HDL 51 10/01/2021 1036   CHOLHDL 4.1 10/01/2021 1036   LDLCALC 135 (H) 10/01/2021 1036    Physical Exam:    VS:  BP 100/64   Pulse ROLLEN)  102   Ht 5' 3 (1.6 m)   Wt 208 lb 9.6 oz (94.6 kg)   SpO2 94%   BMI 36.95 kg/m     Wt Readings from Last 3 Encounters:  02/15/24 208 lb 9.6 oz (94.6 kg)  10/01/21 211 lb (95.7 kg)  08/04/21 221 lb 9.6 oz (100.5 kg)     GEN: Patient is in no acute distress HEENT: Normal NECK: No JVD; No carotid bruits LYMPHATICS: No lymphadenopathy CARDIAC: S1 S2 regular, 2/6 systolic murmur at the apex. RESPIRATORY:  Clear to auscultation without rales, wheezing or rhonchi  ABDOMEN: Soft, non-tender, non-distended MUSCULOSKELETAL:  No edema; No deformity  SKIN: Warm and dry NEUROLOGIC:  Alert and oriented x 3 PSYCHIATRIC:  Normal affect    Signed, [name], MD  02/15/2024 9:29 AM    [facility]   "

## 2024-02-16 ENCOUNTER — Telehealth: Payer: Self-pay | Admitting: *Deleted

## 2024-02-16 ENCOUNTER — Ambulatory Visit: Payer: Self-pay | Admitting: Cardiology

## 2024-02-16 ENCOUNTER — Ambulatory Visit: Attending: Cardiology

## 2024-02-16 DIAGNOSIS — R011 Cardiac murmur, unspecified: Secondary | ICD-10-CM | POA: Diagnosis not present

## 2024-02-16 DIAGNOSIS — E782 Mixed hyperlipidemia: Secondary | ICD-10-CM

## 2024-02-16 DIAGNOSIS — E875 Hyperkalemia: Secondary | ICD-10-CM

## 2024-02-16 LAB — LIPID PANEL
Chol/HDL Ratio: 3.7 ratio (ref 0.0–4.4)
Cholesterol, Total: 168 mg/dL (ref 100–199)
HDL: 46 mg/dL (ref >39)
LDL Chol Calc (NIH): 103 mg/dL — ABNORMAL HIGH (ref 0–99)
Triglycerides: 105 mg/dL (ref 0–149)
VLDL Cholesterol Cal: 19 mg/dL (ref 5–40)

## 2024-02-16 LAB — HEMOGLOBIN A1C
Est. average glucose Bld gHb Est-mCnc: 180 mg/dL
Hgb A1c MFr Bld: 7.9 % — ABNORMAL HIGH (ref 4.8–5.6)

## 2024-02-16 LAB — ECHOCARDIOGRAM COMPLETE
AR max vel: 2.63 cm2
AV Area VTI: 2.81 cm2
AV Area mean vel: 2.78 cm2
AV Mean grad: 3.7 mmHg
AV Peak grad: 6.3 mmHg
Ao pk vel: 1.25 m/s
Area-P 1/2: 2.67 cm2
MV VTI: 2.18 cm2
S' Lateral: 3.2 cm

## 2024-02-16 LAB — COMPREHENSIVE METABOLIC PANEL WITH GFR
ALT: 15 IU/L (ref 0–32)
AST: 15 IU/L (ref 0–40)
Albumin: 4.6 g/dL (ref 3.8–4.9)
Alkaline Phosphatase: 104 IU/L (ref 49–135)
BUN/Creatinine Ratio: 24 — ABNORMAL HIGH (ref 9–23)
BUN: 24 mg/dL (ref 6–24)
Bilirubin Total: 0.2 mg/dL (ref 0.0–1.2)
CO2: 22 mmol/L (ref 20–29)
Calcium: 10.3 mg/dL — ABNORMAL HIGH (ref 8.7–10.2)
Chloride: 98 mmol/L (ref 96–106)
Creatinine, Ser: 0.98 mg/dL (ref 0.57–1.00)
Globulin, Total: 2.7 g/dL (ref 1.5–4.5)
Glucose: 128 mg/dL — ABNORMAL HIGH (ref 70–99)
Potassium: 5.3 mmol/L — ABNORMAL HIGH (ref 3.5–5.2)
Sodium: 136 mmol/L (ref 134–144)
Total Protein: 7.3 g/dL (ref 6.0–8.5)
eGFR: 67 mL/min/1.73 (ref >59)

## 2024-02-16 LAB — TSH: TSH: 1.16 u[IU]/mL (ref 0.450–4.500)

## 2024-02-16 NOTE — Telephone Encounter (Signed)
 Spoke very briefly with patient to remind her about her STRESS TEST on 02/23/24 at 11:00. She was rushing to get off the phone but she said would be here. Was not able to give detailed instructions. Hopefully she will be prepared on Wednesday.

## 2024-02-18 MED ORDER — ROSUVASTATIN CALCIUM 40 MG PO TABS: 40.0000 mg | ORAL_TABLET | Freq: Every day | ORAL | 3 refills | Status: AC

## 2024-02-18 NOTE — Telephone Encounter (Signed)
-----   Message from Jennifer SAUNDERS Revankar sent at 02/16/2024  9:31 AM EST ----- Double statin.  Level lipid check in 6 weeks.  Diet and exercise.  Keep diet low in potassium and recheck potassium in 2 weeks.  Copy primary Jennifer SAUNDERS Crape, MD 02/16/2024 9:31 AM  ----- Message ----- From: Rebecka Memos Lab Results In Sent: 02/16/2024   5:38 AM EST To: Jennifer SAUNDERS Crape, MD

## 2024-02-18 NOTE — Telephone Encounter (Signed)
 Orders entered

## 2024-02-23 ENCOUNTER — Ambulatory Visit

## 2024-02-23 ENCOUNTER — Telehealth: Payer: Self-pay | Admitting: Cardiology

## 2024-02-23 NOTE — Telephone Encounter (Signed)
 Patient is calling to see why her test was denied by her insurance. She was advise to give our office a call. Please advise

## 2024-02-23 NOTE — Telephone Encounter (Signed)
 Pt aware that percert team is working with insurance for approval of additional test. Pt verbalized understanding and had no additional questions.

## 2024-03-23 ENCOUNTER — Other Ambulatory Visit: Payer: Self-pay

## 2024-03-23 ENCOUNTER — Encounter (HOSPITAL_COMMUNITY): Payer: Self-pay | Admitting: Oral Surgery

## 2024-03-23 NOTE — Progress Notes (Addendum)
 SDW CALL  Patient was given pre-op instructions over the phone. The opportunity was given for the patient to ask questions. No further questions asked. Patient verbalized understanding of instructions given.   PCP - Orvil Reaper Cardiologist - Dr. Jory Pulmonologist - Dr. Tanvir Chodri  - requested LOV (02/29/24)  PPM/ICD - denies Device Orders - n/a Rep Notified - n/a  Chest x-ray -  EKG - 02/15/24 Stress Test - pending - patient was scheduled to have stress test on 02/23/24 but it was canceled due to insurance.  Per the patient, the stress test was supposed to be re-ordered for patient to have completed. Patient has not heard back from Dr. Sheldon office about stress test but will follow up with office on 12/19.  Patient states that Dr. Jory wanted all cardiac testing completed prior to surgery.  ECHO - 02/16/24 Cardiac Cath - denies  Sleep Study - OSA_ and wears Bipap   Fasting Blood Sugar - unknown - patient does not check blood sugar at home   Last dose of GLP1 agonist-  last dose of Ozempic was 12/11 and patient is aware not to take a dose 7 days prior to surgery.    Patient instructed to hold Jardiance  for 3 days - last dose should be 12/22  Blood Thinner Instructions: n/a Aspirin  Instructions:  patient instructed to follow up with Dr. Jory about Aspirin  instructions  ERAS Protcol - NPO PRE-SURGERY Ensure or G2- n/a  COVID TEST- n/a   Anesthesia review: yes - cardiac history and pending stress test.   Patient denies shortness of breath, fever, cough and chest pain over the phone call   All instructions explained to the patient, with a verbal understanding of the material. Patient agrees to go over the instructions while at home for a better understanding.    Patient is also aware that her last potassium was elevated and that she needs to have labs recollected.  Per patient she was not aware of this prior to call but will also follow up with Dr.  Jory.

## 2024-03-24 ENCOUNTER — Telehealth: Payer: Self-pay | Admitting: Cardiology

## 2024-03-24 NOTE — Telephone Encounter (Signed)
 Pt called in stating she is supposed ot be having surgery. Informed her the office needs to call or fax over clearance request. She states Dr. Edwyna cleared her already. She states they need to know if she still needs to have stress test. She also asked if she still needs to have her labs redrawn. Please advise

## 2024-03-27 ENCOUNTER — Encounter (HOSPITAL_COMMUNITY): Payer: Self-pay | Admitting: Vascular Surgery

## 2024-03-27 NOTE — Telephone Encounter (Signed)
 Advised that the office doing her surgery needs to send a clearance for procedure. Pt verbalized understanding and had no additional questions. Advised that she needs fasting labs as well.

## 2024-03-27 NOTE — Progress Notes (Signed)
 Anesthesia Chart Review: Melinda Gordon   Case: [id number] Date/Time: 03/31/24 1153   Procedure: DENTAL RESTORATION/EXTRACTIONS   Anesthesia type: General   Pre-op diagnosis: Nonrestorable   [facility] / [facility]   Surgeons: Sheryle Hamilton, DMD       DISCUSSION: Patient is a 58 year old female scheduled for the above procedure.  History includes former smoker, HTN, DM2 (with polyneuropathy), HLD, murmur (no significant valvular disease 02/16/2024 TTE), OSA (uses BiPAP), coronary calcifications (reportedly on chest imaging per pulmonology).  She received endocrinology clearance from Dr. Tobie for surgery. Advised to hold Rybelsus  for 1 week prior to surgery. A1c 7.4% on 12/17/2023--but up to 7.9% on 02/15/2024.  (Scanned under Media tab). Currently, she is on glimepiride  4 mg BID, Jardiance  25 mg daily, metformin  1000 mg BID, Ozempic 2 mg weekly. Last dose recorded as 03/16/2024 and advised to hold for 7 days prior to surgery. Last Jardiance  planned for 03/27/2024.   She received pulmonary clearance from Dr. Mardee who advised BiPAP after surgery. On 02/29/2024 office note, he added, patient is moderately high risk for general surgery due to respiratory issues; I think that the patient should be given a nebulizer treatment an hour before and also after anesthesia.  Patient should be on the lowest oxygen (post surgery) to keep the pulse ox between 90-92%.  Patient may need BiPAP 16/12 after anesthesia.  May call for questions.  By [facility] notes, she had urology urodynamic testing done on 02/28/2024 for refractory OAB and urinary leakage. She had pre- and post (256 cc) residual urine. She refused foley and clean intermittent catheter (CIC), but voided again with only 9 cc PVR. She refused CIC multiple times a day but appears she was receptive for CIC as needed for urinary retention/symptoms and had CIC teaching.  She had cardiology evaluation with Dr. Edwyna on 02/15/2024 for coronary  calcifications and DOE. She denied chest pain, orthopnea, PND. On Crestor  per PCP. DM managed by endocrinology. Former smoker. EKG showed ST, cannot rule out anterior infarct. He ordered a stress test given DOE and coronary calcification and an echo for evaluation of a murmur. Echo was done on 02/16/2024 and showed LVEF 55-60%, normal LVF, but not LV endocardial border not optimally defined to evaluate regional wall motion, grade 1 DD, no significant valvular disease. Insurance would not approve the stress test, so it was not done   She has reached out to Dr. Posey office to clarify status of clearance since stress test was denied by her insurance. I also send communication, as Dr. Terrance office is closed for the holiday until 04/03/2024. Will leave chart for follow-up.  ADDENDUM 03/28/2024 6:13 PM: I heard back from Campbell, Kenzie, NP with cardiology. Per Dr. Edwyna: Difficult to assess risks and stratify without a stress test. It looks like they will try to schedule a GXT instead.  NP did not think this could be scheduled before 03/31/2025 surgery. Discussed with anesthesiologist Keneth DOROTHA Duncans, MD. If cardiology has recommended a stress test before preoperative risk assessment then would advise postponing elective surgery. I have notified Dr. Sheryle.    VS:  Wt Readings from Last 3 Encounters:  02/15/24 94.6 kg  10/01/21 95.7 kg  08/04/21 100.5 kg   BP Readings from Last 3 Encounters:  02/15/24 100/64  10/01/21 114/68  08/04/21 132/84   Pulse Readings from Last 3 Encounters:  02/15/24 (!) 102  10/01/21 (!) 101  08/04/21 99     PROVIDERS: Mean Ellery Brink, DNP,  FNP-C is PCP Revankar, Jennifer, MD is cardiologist Chodri, Tanvir, MD is pulmonologist  Elizbeth Blanch, MD is endocrinologist ([facility]) Margart Sierras, Amr, MD/Razavi, Lauraine, MD is urologist ([facility])   LABS: For day of surgery as indicated. Most recent results in [facility] include: Lab Results  Component Value Date   WBC  7.5 10/01/2021   HGB 13.6 10/01/2021   HCT 44.0 10/01/2021   PLT 333 10/01/2021   GLUCOSE 128 (H) 02/15/2024   CHOL 168 02/15/2024   TRIG 105 02/15/2024   HDL 46 02/15/2024   LDLCALC 103 (H) 02/15/2024   ALT 15 02/15/2024   AST 15 02/15/2024   NA 136 02/15/2024   K 5.3 (H) 02/15/2024   CL 98 02/15/2024   CREATININE 0.98 02/15/2024   BUN 24 02/15/2024   CO2 22 02/15/2024   TSH 1.160 02/15/2024   HGBA1C 7.9 (H) 02/15/2024     IMAGES: No recent images for review in [facility].   EKG: 02/15/2024: Sinus tachycardia at 102 bpm  Cannot rule out Anterior infarct , age undetermined  Abnormal ECG  No previous ECGs available  Confirmed by Edwyna Jennifer [phone]) on 02/15/2024 9:10:03 A   CV: Echo 02/16/2024: IMPRESSIONS   1. Left ventricular ejection fraction, by estimation, is 55 to 60%. The left ventricle has normal function. Left ventricular endocardial border not optimally defined to evaluate regional wall motion. Left ventricular diastolic parameters are consistent with Grade I diastolic dysfunction (impaired relaxation).   2. Right ventricular systolic function was not well visualized. The right ventricular size is not well visualized.   3. There is no evidence of pericardial effusion.   4. No evidence of mitral valve regurgitation.   5. The aortic valve is tricuspid. Aortic valve regurgitation is not visualized.     Past Medical History:  Diagnosis Date   Atherosclerotic heart disease    Diabetes (HCC) 06/24/2018   Diabetes mellitus without complication (HCC)    Diabetic polyneuropathy associated with type 2 diabetes mellitus (HCC) 09/07/2019   Essential hypertension 06/28/2020   Fatigue    Foot callus 07/22/2016   Heart murmur    History of nicotine dependence    Hyperlipidemia    Hypertension    Metatarsalgia, left foot 07/22/2016   OSA (obstructive sleep apnea)    Sleep apnea 06/24/2018    Past Surgical History:  Procedure Laterality Date   ABDOMINAL  HYSTERECTOMY      MEDICATIONS:  Ascorbic Acid (VITAMIN C PO)   aspirin  EC 81 MG tablet   BIOTIN PO   buPROPion (WELLBUTRIN XL) 300 MG 24 hr tablet   Cholecalciferol (VITAMIN D-3 PO)   Cyanocobalamin (VITAMIN B12 PO)   glimepiride  (AMARYL ) 4 MG tablet   hydrOXYzine (VISTARIL) 25 MG capsule   JARDIANCE  25 MG TABS tablet   lisinopril (ZESTRIL) 5 MG tablet   metFORMIN  (GLUCOPHAGE ) 1000 MG tablet   mirabegron ER (MYRBETRIQ) 25 MG TB24 tablet   Omega-3 Fatty Acids (FISH OIL) 1000 MG CAPS   omeprazole  (PRILOSEC) 40 MG capsule   OZEMPIC, 2 MG/DOSE, 8 MG/3ML SOPN   Probiotic Product (PROBIOTIC PO)   rosuvastatin  (CRESTOR ) 40 MG tablet   TURMERIC PO   zinc sulfate, 50mg  elemental zinc, 220 (50 Zn) MG capsule    Isaiah Ruder, PA-C Surgical Short Stay/Anesthesiology [facility] Phone [phone] [facility] Phone [phone] 03/28/2024 1:30 PM

## 2024-03-28 ENCOUNTER — Telehealth: Payer: Self-pay

## 2024-03-28 DIAGNOSIS — R079 Chest pain, unspecified: Secondary | ICD-10-CM

## 2024-03-28 DIAGNOSIS — R0609 Other forms of dyspnea: Secondary | ICD-10-CM

## 2024-03-28 NOTE — Telephone Encounter (Signed)
 Resending with high priority given surgery is scheduled in 3 days.

## 2024-03-28 NOTE — Telephone Encounter (Signed)
 Dr. Edwyna, you recently saw this pt in clinic. A stress test and echo were ordered for DOE and murmur, but insurance would not cover the stress test, so it was not done. The 02/16/2024 echo showed LVEF 55-60%, normal LVF, LV endocardial border not optimally defined to evaluate regional wall motion, grade 1 DD, no significant valvular disease.   Are you able to comment on surgical clearance for upcoming surgical dental extraction and removal of bilateral mandibular lingual tori under general anesthesia scheduled for 03/31/2024? Please route your response to P CV DIV PREOP. Thank you!

## 2024-03-28 NOTE — Telephone Encounter (Signed)
"  ° °  Pre-operative Risk Assessment    Patient Name: Melinda Gordon  DOB: 1965-04-08 MRN: [id number]   Date of last office visit: 02/15/24 Date of next office visit: Not scheduled  Request for Surgical Clearance    Procedure:  Dental Extraction - Amount of Teeth to be Pulled:  1and remove bilateral mandibular lingual tori   Date of Surgery:  Clearance 03/31/24                                Surgeon:  Dr. Glendia Primrose  Surgeon's Group or Practice Name:  [facility] Phone number:  [phone]  Fax number:  [phone]   Type of Clearance Requested:   - Medical  - Pharmacy:  Hold Aspirin  (Not requested but on med list)   Type of Anesthesia:  General    Additional requests/questions:    Bonney Ival LOISE Gerome   03/28/2024, 1:07 PM   "

## 2024-03-29 NOTE — Addendum Note (Signed)
 Addended by: GLENFORD ALAN CROME on: 03/29/2024 08:12 AM   Modules accepted: Orders

## 2024-03-31 ENCOUNTER — Ambulatory Visit (HOSPITAL_COMMUNITY): Admission: RE | Admit: 2024-03-31 | Source: Home / Self Care | Admitting: Oral Surgery

## 2024-03-31 ENCOUNTER — Encounter: Admission: RE | Payer: Self-pay

## 2024-03-31 HISTORY — DX: Cardiac murmur, unspecified: R01.1

## 2024-03-31 SURGERY — DENTAL RESTORATION/EXTRACTIONS
Anesthesia: General

## 2024-03-31 NOTE — Addendum Note (Signed)
 Addended by: ONEITA BERLINER on: 03/31/2024 07:17 AM   Modules accepted: Orders

## 2024-04-04 ENCOUNTER — Ambulatory Visit

## 2024-04-04 ENCOUNTER — Ambulatory Visit: Payer: Self-pay | Admitting: Cardiology

## 2024-04-04 DIAGNOSIS — R0609 Other forms of dyspnea: Secondary | ICD-10-CM | POA: Insufficient documentation

## 2024-04-04 DIAGNOSIS — R079 Chest pain, unspecified: Secondary | ICD-10-CM | POA: Diagnosis not present

## 2024-04-04 LAB — EXERCISE TOLERANCE TEST
Angina Index: 0
Estimated workload: 7
Exercise duration (min): 4 min
Exercise duration (sec): 30 s
MPHR: 162 {beats}/min
Peak HR: 136 {beats}/min
Percent HR: 83 %
RPE: 19
Rest HR: 86 {beats}/min

## 2024-04-05 NOTE — Telephone Encounter (Signed)
"  ° °  Patient Name: Melinda Gordon  DOB: 1966-01-04 MRN: [id number]  Primary Cardiologist: None  Chart reviewed as part of pre-operative protocol coverage. Given past medical history and time since last visit, based on ACC/AHA guidelines, [name] is at acceptable risk for the planned procedure without further cardiovascular testing.   The results of the study (stress test) is unremarkable. Please inform patient. I will discuss in detail at next appointment. Cc  primary care/referring physician [name], MD 04/04/2024 5:37 PM   The patient was advised that if she develops new symptoms prior to surgery to contact our office to arrange for a follow-up visit, and she verbalized understanding.  I will route this recommendation to the requesting party via Epic fax function and remove from pre-op pool.  Please call with questions.  Lamarr Satterfield, NP 04/05/2024, 8:37 AM  "

## 2024-04-05 NOTE — Telephone Encounter (Signed)
 Testing completed and addendum made to note.

## 2024-04-07 NOTE — Telephone Encounter (Signed)
Results reviewed with pt as per Dr. Revankar's note.  Pt verbalized understanding and had no additional questions. Routed to PCP.  

## 2024-05-26 ENCOUNTER — Ambulatory Visit (HOSPITAL_COMMUNITY): Admit: 2024-05-26 | Admitting: Oral Surgery

## 2024-05-26 SURGERY — DENTAL RESTORATION/EXTRACTIONS
Anesthesia: General
